# Patient Record
Sex: Female | Born: 1990 | Race: Black or African American | Hispanic: No | Marital: Single | Smoking: Former smoker
Health system: Southern US, Community
[De-identification: ages and names within clinical notes are randomized; demographics above are authoritative.]

## PROBLEM LIST (undated history)

## (undated) DIAGNOSIS — D6800 Von Willebrand disease, unspecified: Secondary | ICD-10-CM

## (undated) HISTORY — PX: TONSILLECTOMY: SUR1361

---
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
## 2011-03-07 ENCOUNTER — Emergency Department: Payer: Self-pay | Admitting: *Deleted

## 2011-09-16 ENCOUNTER — Emergency Department: Payer: Self-pay | Admitting: Emergency Medicine

## 2011-10-10 ENCOUNTER — Emergency Department: Payer: Self-pay | Admitting: Emergency Medicine

## 2012-06-11 ENCOUNTER — Encounter (HOSPITAL_COMMUNITY): Payer: Self-pay | Admitting: Emergency Medicine

## 2012-06-11 ENCOUNTER — Emergency Department (HOSPITAL_COMMUNITY)
Admission: EM | Admit: 2012-06-11 | Discharge: 2012-06-12 | Disposition: A | Discharge status: Home / Self Care | Payer: Medicaid Other | Attending: Emergency Medicine | Admitting: Emergency Medicine

## 2012-06-11 DIAGNOSIS — O219 Vomiting of pregnancy, unspecified: Secondary | ICD-10-CM | POA: Insufficient documentation

## 2012-06-11 HISTORY — DX: Von Willebrand's disease: D68.0

## 2012-06-11 HISTORY — DX: Von Willebrand disease, unspecified: D68.00

## 2012-06-11 LAB — POCT PREGNANCY, URINE: Preg Test, Ur: POSITIVE — AB

## 2012-06-11 LAB — POCT I-STAT, CHEM 8
Calcium, Ion: 1.25 mmol/L — ABNORMAL HIGH (ref 1.12–1.23)
Chloride: 105 mEq/L (ref 96–112)
Creatinine, Ser: 0.7 mg/dL (ref 0.50–1.10)

## 2012-06-11 MED ORDER — ONDANSETRON 4 MG PO TBDP
8.0000 mg | ORAL_TABLET | Freq: Once | ORAL | Status: AC
  Administered 2012-06-12: 8 mg via ORAL
  Filled 2012-06-11: qty 2

## 2012-06-11 NOTE — ED Provider Notes (Signed)
History     CSN: [id number]  Arrival date & time 06/11/12  1929   First MD Initiated Contact with Patient 06/11/12 2348      Chief Complaint  Patient presents with  . Emesis During Pregnancy    (Consider location/radiation/quality/duration/timing/severity/associated sxs/prior treatment) HPI Patient complains of vomiting apparently 7 times since just today. [place] of mild nausea present She is presently [redacted] weeks pregnant. She denies other complaint. Denies abdominal pain denies lightheadedness with standing no headache no fever no other complaint. No treatment prior to coming here. Nothing makes symptoms better or worse Past Medical History  Diagnosis Date  . Von Willebrand disease     History reviewed. No pertinent past surgical history.  No family history on file.  History  Substance Use Topics  . Smoking status: Never Smoker   . Smokeless tobacco: Not on file  . Alcohol Use: No    OB History   Grav Para Term Preterm Abortions TAB SAB Ect Mult Living   1               Review of Systems  Constitutional: Negative.   HENT: Negative.   Respiratory: Negative.   Cardiovascular: Negative.   Gastrointestinal: Positive for nausea and vomiting.  Genitourinary:       Pregnant  Musculoskeletal: Negative.   Skin: Negative.   Neurological: Negative.   Psychiatric/Behavioral: Negative.   All other systems reviewed and are negative.    Allergies  Peanuts and Aspirin  Home Medications   Current Outpatient Rx  Name  Route  Sig  Dispense  Refill  . acetaminophen (TYLENOL) 500 MG tablet   Oral   Take 500 mg by mouth every 6 (six) hours as needed for pain.         . Prenatal Vit-Fe Fumarate-FA (PRENATAL MULTIVITAMIN) TABS   Oral   Take 1 tablet by mouth daily at 12 noon.         Marland Kitchen PRESCRIPTION MEDICATION   Oral   Take 1,000 mg by mouth once.           BP 105/62  Pulse 102  Temp(Src) 98.7 F (37.1 C) (Oral)  Resp 16  SpO2 100%  LMP  03/09/2012  Physical Exam  Nursing note and vitals reviewed. Constitutional: She is oriented to person, place, and time. She appears well-developed and well-nourished.  HENT:  Head: Normocephalic and atraumatic.  Eyes: Conjunctivae are normal. Pupils are equal, round, and reactive to light.  Neck: Neck supple. No tracheal deviation present. No thyromegaly present.  Cardiovascular: Normal rate and regular rhythm.   No murmur heard. Pulmonary/Chest: Effort normal and breath sounds normal.  Abdominal: Soft. Bowel sounds are normal. She exhibits no distension. There is no tenderness.  Musculoskeletal: Normal range of motion. She exhibits no edema and no tenderness.  Neurological: She is alert and oriented to person, place, and time. Coordination normal.  Gait normal, not lightheaded on standing  Skin: Skin is warm and dry. No rash noted.  Psychiatric: She has a normal mood and affect.    ED Course  Procedures (including critical care time)  Labs Reviewed  POCT PREGNANCY, URINE - Abnormal; Notable for the following:    Preg Test, Ur POSITIVE (*)    All other components within normal limits  POCT I-STAT, CHEM 8 - Abnormal; Notable for the following:    Calcium, Ion 1.25 (*)    Hemoglobin 11.2 (*)    HCT 33.0 (*)    All other components within normal  limits   No results found. Results for orders placed during the hospital encounter of 06/11/12  POCT PREGNANCY, URINE      Result Value Range   Preg Test, Ur POSITIVE (*) NEGATIVE  POCT I-STAT, CHEM 8      Result Value Range   Sodium 137  135 - 145 mEq/L   Potassium 3.9  3.5 - 5.1 mEq/L   Chloride 105  96 - 112 mEq/L   BUN 8  6 - 23 mg/dL   Creatinine, Ser 1.91  0.50 - 1.10 mg/dL   Glucose, Bld 84  70 - 99 mg/dL   Calcium, Ion 4.78 (*) 1.12 - 1.23 mmol/L   TCO2 25  0 - 100 mmol/L   Hemoglobin 11.2 (*) 12.0 - 15.0 g/dL   HCT 29.5 (*) 62.1 - 30.8 %   No results found.   No diagnosis found.  Patient minister Zofran orally.  1:25 AM patient is asymptomatic able to drink without nausea or vomiting  MDM  No evidence of dehydration Plan prescription Zofran. Patient has appointment for ultrasound tomorrow with her GYN in [facility] emesis gravidarum        Doug Sou, MD 06/12/12 [phone]

## 2012-06-11 NOTE — ED Notes (Addendum)
Pt states she is [redacted] weeks pregnant.  Reports nausea and vomiting since yesterday.  Vomited x 6 today. 1st OB- Gyn appt tomorrow.

## 2012-06-12 MED ORDER — ONDANSETRON HCL 4 MG PO TABS: 8.0000 mg | ORAL_TABLET | Freq: Three times a day (TID) | ORAL | Status: DC | PRN

## 2012-06-12 NOTE — ED Notes (Signed)
Pt tolerating gingerale after PO zofran.

## 2012-06-24 ENCOUNTER — Emergency Department: Payer: Self-pay | Admitting: Emergency Medicine

## 2013-01-24 ENCOUNTER — Emergency Department: Payer: Self-pay | Admitting: Emergency Medicine

## 2013-02-24 ENCOUNTER — Emergency Department: Payer: Self-pay | Admitting: Emergency Medicine

## 2013-05-31 ENCOUNTER — Emergency Department: Payer: Self-pay | Admitting: Internal Medicine

## 2013-08-22 ENCOUNTER — Inpatient Hospital Stay: Payer: Self-pay | Admitting: Internal Medicine

## 2013-08-22 LAB — URINALYSIS, COMPLETE
Bilirubin,UR: NEGATIVE
Blood: NEGATIVE
GLUCOSE, UR: NEGATIVE mg/dL (ref 0–75)
KETONE: NEGATIVE
Nitrite: NEGATIVE
PROTEIN: NEGATIVE
Ph: 5 (ref 4.5–8.0)
Specific Gravity: 1.038 (ref 1.003–1.030)
WBC UR: 41 /HPF (ref 0–5)

## 2013-08-22 LAB — CBC WITH DIFFERENTIAL/PLATELET
BASOS ABS: 0.1 10*3/uL (ref 0.0–0.1)
Basophil %: 0.4 %
Eosinophil #: 0.2 10*3/uL (ref 0.0–0.7)
Eosinophil %: 0.9 %
HCT: 37 % (ref 35.0–47.0)
HGB: 11.8 g/dL — AB (ref 12.0–16.0)
LYMPHS ABS: 2.5 10*3/uL (ref 1.0–3.6)
LYMPHS PCT: 11.7 %
MCH: 26.5 pg (ref 26.0–34.0)
MCHC: 31.8 g/dL — AB (ref 32.0–36.0)
MCV: 83 fL (ref 80–100)
MONO ABS: 1.2 x10 3/mm — AB (ref 0.2–0.9)
MONOS PCT: 5.8 %
NEUTROS PCT: 81.2 %
Neutrophil #: 17.4 10*3/uL — ABNORMAL HIGH (ref 1.4–6.5)
PLATELETS: 329 10*3/uL (ref 150–440)
RBC: 4.44 10*6/uL (ref 3.80–5.20)
RDW: 14.1 % (ref 11.5–14.5)
WBC: 21.4 10*3/uL — ABNORMAL HIGH (ref 3.6–11.0)

## 2013-08-22 LAB — BASIC METABOLIC PANEL
ANION GAP: 8 (ref 7–16)
BUN: 14 mg/dL (ref 7–18)
Calcium, Total: 9.2 mg/dL (ref 8.5–10.1)
Chloride: 111 mmol/L — ABNORMAL HIGH (ref 98–107)
Co2: 19 mmol/L — ABNORMAL LOW (ref 21–32)
Creatinine: 0.87 mg/dL (ref 0.60–1.30)
EGFR (African American): >60
Glucose: 97 mg/dL (ref 65–99)
OSMOLALITY: 276 (ref 275–301)
POTASSIUM: 4.5 mmol/L (ref 3.5–5.1)
Sodium: 138 mmol/L (ref 136–145)

## 2013-08-22 LAB — MONONUCLEOSIS SCREEN: Mono Test: NEGATIVE

## 2013-08-23 LAB — CBC WITH DIFFERENTIAL/PLATELET
BASOS ABS: 0 10*3/uL (ref 0.0–0.1)
Basophil %: 0.2 %
EOS PCT: 2.1 %
Eosinophil #: 0.3 10*3/uL (ref 0.0–0.7)
HCT: 33.5 % — ABNORMAL LOW (ref 35.0–47.0)
HGB: 10.7 g/dL — AB (ref 12.0–16.0)
LYMPHS PCT: 21.4 %
Lymphocyte #: 3.3 10*3/uL (ref 1.0–3.6)
MCH: 26.9 pg (ref 26.0–34.0)
MCHC: 32 g/dL (ref 32.0–36.0)
MCV: 84 fL (ref 80–100)
Monocyte #: 0.7 x10 3/mm (ref 0.2–0.9)
Monocyte %: 4.4 %
NEUTROS PCT: 71.9 %
Neutrophil #: 11.1 10*3/uL — ABNORMAL HIGH (ref 1.4–6.5)
Platelet: 284 10*3/uL (ref 150–440)
RBC: 3.98 10*6/uL (ref 3.80–5.20)
RDW: 14.2 % (ref 11.5–14.5)
WBC: 15.5 10*3/uL — ABNORMAL HIGH (ref 3.6–11.0)

## 2013-08-24 LAB — BETA STREP CULTURE(ARMC)

## 2013-08-27 LAB — CULTURE, BLOOD (SINGLE)

## 2013-09-15 ENCOUNTER — Emergency Department: Payer: Self-pay | Admitting: Internal Medicine

## 2013-09-15 LAB — URINALYSIS, COMPLETE
BILIRUBIN, UR: NEGATIVE
Glucose,UR: NEGATIVE mg/dL (ref 0–75)
Ketone: NEGATIVE
Nitrite: NEGATIVE
PH: 5 (ref 4.5–8.0)
PROTEIN: NEGATIVE
RBC,UR: 257 /HPF (ref 0–5)
Specific Gravity: 1.023 (ref 1.003–1.030)
Squamous Epithelial: 7

## 2013-09-15 LAB — MONONUCLEOSIS SCREEN: MONO TEST: NEGATIVE

## 2013-09-16 LAB — URINE CULTURE

## 2013-09-17 LAB — BETA STREP CULTURE(ARMC)

## 2014-02-04 ENCOUNTER — Encounter (HOSPITAL_COMMUNITY): Payer: Self-pay | Admitting: Emergency Medicine

## 2014-07-27 NOTE — H&P (Signed)
PATIENT NAME:  Abigail Rios, Abigail Rios DATE OF BIRTH:  1990/06/27  DATE OF ADMISSION:  08/22/2013  PRIMARY CARE PHYSICIAN: None.   CHIEF COMPLAINT: Body aches, sore throat, fever and nausea or vomiting.   HISTORY OF PRESENT ILLNESS:  Ms. Abigail Rios is a 24 year old African American female with history of von Willebrand's disease, comes to the Emergency Room after she started having body aches, nausea, vomiting and a fever at home of 103. The patient's temperature here in the Emergency Room was 98.9. She was somewhat tachycardiac, on workup showed a urinary tract infection and elevated white count of 21,000. She was started on IV Zosyn and IV fluids were given. In the Emergency Room. Blood cultures were sent. Urine culture was sent. She is being admitted for further evaluation and management.   PAST MEDICAL HISTORY: 1.  Von Willebrand's disease.  2.  Tonsillectomy and adenoidectomy.   ALLERGIES: ASPIRIN AND PEANUTS.   SOCIAL HISTORY: Lives at home, smokes about 2 to 3 cigarettes every day, nonalcoholic. Denies any other drug use.   FAMILY HISTORY: Positive for hypertension.   REVIEW OF SYSTEMS:   CONSTITUTIONAL: Positive for fever, fatigue, weakness.  EYES: No blurred or double vision.  ENT: No tinnitus, ear pain, hearing loss. Positive for sore throat.  CARDIOVASCULAR: No chest pain, orthopnea, edema or hypertension.  GASTROINTESTINAL: Positive for nausea and vomiting. No diarrhea or abdominal pain. No GERD.  GENITOURINARY: No dysuria, hematuria or frequency. Positive for change in color of urine.  ENDOCRINE: No polyuria, nocturia or thyroid problems.  HEMATOLOGY: No anemia or easy bruising. Positive for bleeding disorder which is von Willebrand's disorder.  MUSCULOSKELETAL: Positive for back pain.  NEUROLOGICAL:  No CVA, transient ischemic attack or dysarthria, vertigo.  PSYCHIATRIC: No anxiety or depression.  All other systems reviewed and negative.   PHYSICAL  EXAMINATION: GENERAL: The patient is awake, alert and oriented x 3. Mild to moderate distress due to due to increased fatigability.  VITAL SIGNS: Temperature is 99.8, pulse is 80, blood pressure is 103/67, sats are 99% on room air.  HEENT: Atraumatic, normocephalic. Pupils are equal, round and reactive to light and accommodation. EOM intact. Oral mucosa is moist.  NECK: Supple. No JVD. No carotid bruit.  LUNGS: Clear to auscultation bilaterally. No rales, rhonchi, respiratory distress or labored breathing.  HEART: Both the heart sounds are normal. Mild tachycardia, no murmur heard. PMI not lateralized. Chest nontender.  EXTREMITIES: Good pedal pulses, good femoral pulses. No lower extremity edema.  ABDOMEN: Soft, benign, nontender. No organomegaly. Positive bowel sounds.  NEUROLOGIC: Grossly intact cranial nerves II through XII. No motor or sensory deficit.  SKIN: Warm and dry. No rash.  PSYCHIATRIC: The patient is awake, alert, oriented x 3.   LABORATORY, DIAGNOSTIC AND RADIOLOGICAL DATA:  Blood culture negative in 8 to 12 hours. Urinalysis positive for urinary tract infection. Mono test is negative. White count is 21.0, hemoglobin and hematocrit is 11.8 and 37.0, platelet count is 329. Basic metabolic panel within normal limits except bicarbonate of 19. Urine pregnancy test is negative.   ASSESSMENT AND PLAN: A 24 year old Abigail Rios with history of von Willebrand's disease, comes in with:  1.  Suspected acute pyelonephritis. The patient came in with fever, body aches, back pain, elevated white count and abnormal urine. She has been started on IV fluids. Continue Zosyn, will continue Rocephin for now. Follow blood cultures which have been negative so far. Follow up urine cultures. We will order CBC for tomorrow.  2.  Nausea and vomiting with poor p.o. intake, suspected due to #1.  Continue p.r.n. Zofran and continue IV fluids for hydration.  3.  Elevated white count, suspected due to urinary  tract infection.  4.  Deep vein thrombosis prophylaxis. Subcutaneous heparin t.i.d.  5.  Further workup according to the patient's clinical course.   Hospital admission plan was discussed with the patient and the patient's family members were present in the room.   TIME SPENT: 50 minutes.   ____________________________ Wylie Hail Allena Katz, MD sap:cs D: 08/22/2013 14:38:18 ET T: 08/22/2013 15:02:40 ET JOB#: [id number]  cc: Tajay Muzzy A. Allena Katz, MD, <Dictator> Willow Ora MD ELECTRONICALLY SIGNED 08/26/2013 16:24

## 2014-07-27 NOTE — Discharge Summary (Signed)
PATIENT NAME:  Abigail Rios, Jaleya M MR#:  [id number] DATE OF BIRTH:  Apr 12, 1990  DATE OF ADMISSION:  08/22/2013 DATE OF DISCHARGE:  08/23/2013  DISCHARGE DIAGNOSES: 1. Acute viral syndrome. 2. AcutePyelonephritis.  DISCHARGE MEDICATIONS: Keflex 500 mg p.o. 4 times daily.  DIET: Regular diet.   CONSULTATIONS: None.   HOSPITAL COURSE: The patient is a 24 year old female patient admitted  on 20th because of generalized body aches, sore throat, fever, and nausea and fever. Look at the history and physical for full details. The patient had a fever of 103 at home and initial white count in the Emergency Room was 21,000 and initial temperature in the Emergency Room 98.9. The patient was seen in the Emergency Room and patient admitted to hospitalist service because of suspected pyelonephritis. The patient's urea was positive for infection and her mono test was negative so she was started on IV Rocephin and we started on lots of fluids for her. Nausea improved with IV Zofran.   Today I saw the patient. She is much better. No more nausea, tolerated the diet, and did not have any body aches or fever and the patient's white count on admission was 21.4 and then yesterday it was 15.5. The patient's urine cultures report are pending and beta strep cultures are negative. The patient's kidney function is stable and I discharged her with Keflex to take and the patient's condition is stable.   VITALS SIGNS: On discharge: Temperature 98.7, heart rate 75, blood pressure 99/63, sats 99% on room air. The patient was discharged home in stable condition.   PHYSICAL EXAMINATION:  LUNGS: Clear.  GASTROINTESTINAL: Nontender, , nondistended. No CVA tenderness.  CARDIOVASCULAR: S1, S2 regular. No murmurs.   TIME SPENT: More than 30 minutes.    ____________________________ Katha HammingSnehalatha Remi Lopata, MD sk:lt D: 08/24/2013 14:27:46 ET T: 08/24/2013 21:44:54 ET JOB#: [id number]  cc: Katha HammingSnehalatha Mckinzi Eriksen, MD, <Dictator> Katha HammingSNEHALATHA  Logyn Kendrick MD ELECTRONICALLY SIGNED 08/30/2013 12:06

## 2014-12-06 ENCOUNTER — Encounter: Payer: Self-pay | Admitting: Emergency Medicine

## 2014-12-06 ENCOUNTER — Emergency Department
Admission: EM | Admit: 2014-12-06 | Discharge: 2014-12-06 | Disposition: A | Discharge status: Home / Self Care | Payer: Medicaid Other | Attending: Emergency Medicine | Admitting: Emergency Medicine

## 2014-12-06 DIAGNOSIS — Y999 Unspecified external cause status: Secondary | ICD-10-CM | POA: Insufficient documentation

## 2014-12-06 DIAGNOSIS — Z720 Tobacco use: Secondary | ICD-10-CM | POA: Insufficient documentation

## 2014-12-06 DIAGNOSIS — X58XXXA Exposure to other specified factors, initial encounter: Secondary | ICD-10-CM | POA: Insufficient documentation

## 2014-12-06 DIAGNOSIS — S46811A Strain of other muscles, fascia and tendons at shoulder and upper arm level, right arm, initial encounter: Secondary | ICD-10-CM

## 2014-12-06 DIAGNOSIS — Y929 Unspecified place or not applicable: Secondary | ICD-10-CM | POA: Insufficient documentation

## 2014-12-06 DIAGNOSIS — S46911A Strain of unspecified muscle, fascia and tendon at shoulder and upper arm level, right arm, initial encounter: Secondary | ICD-10-CM | POA: Insufficient documentation

## 2014-12-06 DIAGNOSIS — Y939 Activity, unspecified: Secondary | ICD-10-CM | POA: Insufficient documentation

## 2014-12-06 DIAGNOSIS — Z79899 Other long term (current) drug therapy: Secondary | ICD-10-CM | POA: Insufficient documentation

## 2014-12-06 MED ORDER — KETOROLAC TROMETHAMINE 60 MG/2ML IM SOLN
INTRAMUSCULAR | Status: AC
  Filled 2014-12-06: qty 2

## 2014-12-06 MED ORDER — TIZANIDINE HCL 4 MG PO TABS: 4.0000 mg | ORAL_TABLET | Freq: Three times a day (TID) | ORAL | Status: AC

## 2014-12-06 MED ORDER — KETOROLAC TROMETHAMINE 60 MG/2ML IM SOLN
60.0000 mg | Freq: Once | INTRAMUSCULAR | Status: AC
  Administered 2014-12-06: 60 mg via INTRAMUSCULAR

## 2014-12-06 NOTE — ED Provider Notes (Signed)
[facility] Emergency Department Provider Note ____________________________________________  Time seen: Approximately 11:08 AM  I have reviewed the triage vital signs and the nursing notes.   HISTORY  Chief Complaint Shoulder Pain   HPI Abigail Rios is a 24 y.o. female who presents to the emergency department for evaluation of shoulder pain that started last night and worsened through the night. She denies injury. She has not taken anything for pain. Pain increases with any movement.   Past Medical History  Diagnosis Date  . Von Willebrand disease     There are no active problems to display for this patient.   Past Surgical History  Procedure Laterality Date  . Tonsillectomy      Current Outpatient Rx  Name  Route  Sig  Dispense  Refill  . acetaminophen (TYLENOL) 500 MG tablet   Oral   Take 500 mg by mouth every 6 (six) hours as needed for pain.         Marland Kitchen ondansetron (ZOFRAN) 4 MG tablet   Oral   Take 2 tablets (8 mg total) by mouth every 8 (eight) hours as needed for nausea.   20 tablet   0   . Prenatal Vit-Fe Fumarate-FA (PRENATAL MULTIVITAMIN) TABS   Oral   Take 1 tablet by mouth daily at 12 noon.         Marland Kitchen PRESCRIPTION MEDICATION   Oral   Take 1,000 mg by mouth once.         Marland Kitchen tiZANidine (ZANAFLEX) 4 MG tablet   Oral   Take 1 tablet (4 mg total) by mouth 3 (three) times daily.   30 tablet   0     Allergies Peanuts and Aspirin  No family history on file.  Social History Social History  Substance Use Topics  . Smoking status: Current Every Day Smoker  . Smokeless tobacco: None  . Alcohol Use: No    Review of Systems Constitutional: No recent illness. Eyes: No visual changes. ENT: No sore throat. Cardiovascular: Denies chest pain or palpitations. Respiratory: Denies shortness of breath. Gastrointestinal: No abdominal pain.  Genitourinary: Negative for dysuria. Musculoskeletal: Pain in right shoulder. Skin:  Negative for rash. Neurological: Negative for headaches, focal weakness or numbness. 10-point ROS otherwise negative.  ____________________________________________   PHYSICAL EXAM:  VITAL SIGNS: ED Triage Vitals  Enc Vitals Group     BP 12/06/14 0744 106/61 mmHg     Pulse Rate 12/06/14 0744 68     Resp 12/06/14 0744 18     Temp 12/06/14 0744 98.1 F (36.7 C)     Temp Source 12/06/14 0744 Oral     SpO2 12/06/14 0744 96 %     Weight 12/06/14 0745 209 lb (94.802 kg)     Height 12/06/14 0745  (1.702 m)     Head Cir --      Peak Flow --      Pain Score 12/06/14 0746 8     Pain Loc --      Pain Edu? --      Excl. in GC? --     Constitutional: Alert and oriented. Well appearing and in no acute distress. Eyes: Conjunctivae are normal. EOMI. Head: Atraumatic. Nose: No congestion/rhinnorhea. Neck: No stridor.  Respiratory: Normal respiratory effort.   Musculoskeletal: Tenderness with light palpation over trapezius area. Neurologic:  Normal speech and language. No gross focal neurologic deficits are appreciated. Speech is normal. No gait instability. Skin:  Skin is warm, dry and  intact. Atraumatic. Psychiatric: Mood and affect are normal. Speech and behavior are normal.  ____________________________________________   LABS (all labs ordered are listed, but only abnormal results are displayed)  Labs Reviewed - No data to display ____________________________________________  RADIOLOGY  Not indicated ____________________________________________   PROCEDURES  Procedure(s) performed: None   ____________________________________________   INITIAL IMPRESSION / ASSESSMENT AND PLAN / ED COURSE  Pertinent labs & imaging results that were available during my care of the patient were reviewed by me and considered in my medical decision making (see chart for details).  IM Toradol given in the ER. She is to follow up with the orthopedist or primary care for symptoms that  are not improving over the next few days.She was advised to return to the ER for symptoms that change or worsen if unable to schedule an appointment.  ____________________________________________   FINAL CLINICAL IMPRESSION(S) / ED DIAGNOSES  Final diagnoses:  Trapezius muscle strain, right, initial encounter       Chinita Pester, FNP 12/06/14 1119  Sharyn Creamer, MD 12/06/14 1527

## 2014-12-06 NOTE — ED Notes (Signed)
Pt c/o right shoulder pain that started yesterday, denies injury.Abigail Rios

## 2015-07-14 ENCOUNTER — Emergency Department
Admission: EM | Admit: 2015-07-14 | Discharge: 2015-07-14 | Disposition: A | Discharge status: Home / Self Care | Payer: Medicaid Other | Attending: Emergency Medicine | Admitting: Emergency Medicine

## 2015-07-14 ENCOUNTER — Encounter: Payer: Self-pay | Admitting: Emergency Medicine

## 2015-07-14 DIAGNOSIS — O9989 Other specified diseases and conditions complicating pregnancy, childbirth and the puerperium: Secondary | ICD-10-CM | POA: Diagnosis present

## 2015-07-14 DIAGNOSIS — Z87891 Personal history of nicotine dependence: Secondary | ICD-10-CM | POA: Diagnosis not present

## 2015-07-14 DIAGNOSIS — M5441 Lumbago with sciatica, right side: Secondary | ICD-10-CM | POA: Insufficient documentation

## 2015-07-14 DIAGNOSIS — Z3A16 16 weeks gestation of pregnancy: Secondary | ICD-10-CM | POA: Insufficient documentation

## 2015-07-14 LAB — URINALYSIS COMPLETE WITH MICROSCOPIC (ARMC ONLY)
BILIRUBIN URINE: NEGATIVE
GLUCOSE, UA: NEGATIVE mg/dL
HGB URINE DIPSTICK: NEGATIVE
NITRITE: NEGATIVE
PH: 6 (ref 5.0–8.0)
Protein, ur: NEGATIVE mg/dL
SPECIFIC GRAVITY, URINE: 1.021 (ref 1.005–1.030)

## 2015-07-14 NOTE — ED Provider Notes (Signed)
[facility] Emergency Department Provider Note  ____________________________________________  Time seen: Approximately 1:02 PM  I have reviewed the triage vital signs and the nursing notes.   HISTORY  Chief Complaint Fall    HPI Abigail Rios is a 25 y.o. female , NAD, presents to the emergency department via EMS after a fall at home. Her mother is at the bedside. States that she is [redacted] weeks pregnant has been having lower back pain over the last 3 days. Noted some weakness in the right lower leg and as needed assistance from her significant other with ambulation at times. States she was on her own today, stood to go the restroom and fell onto her right buttock. She denies any trauma to the abdomen. Is not having any abdominal pain, vaginal bleeding, vaginal discharge. Did take some Tylenol last night with mild alleviation of pain but no resolution of symptoms. States she is up-to-date on her obstetric follow-ups and has had no complications to her current pregnancy. Denies dysuria, hematuria, urinary frequency, urgency, hesitancy. Has had no fevers, chills, body aches. Denies any chest pain or shortness of breath. Did not incur a head injury during her fall.   Past Medical History  Diagnosis Date  . Von Willebrand disease (HCC)     There are no active problems to display for this patient.   Past Surgical History  Procedure Laterality Date  . Tonsillectomy      Current Outpatient Rx  Name  Route  Sig  Dispense  Refill  . acetaminophen (TYLENOL) 500 MG tablet   Oral   Take 500 mg by mouth every 6 (six) hours as needed for pain.         Marland Kitchen ondansetron (ZOFRAN) 4 MG tablet   Oral   Take 2 tablets (8 mg total) by mouth every 8 (eight) hours as needed for nausea.   20 tablet   0   . Prenatal Vit-Fe Fumarate-FA (PRENATAL MULTIVITAMIN) TABS   Oral   Take 1 tablet by mouth daily at 12 noon.         Marland Kitchen PRESCRIPTION MEDICATION   Oral   Take 1,000 mg by  mouth once.         Marland Kitchen tiZANidine (ZANAFLEX) 4 MG tablet   Oral   Take 1 tablet (4 mg total) by mouth 3 (three) times daily.   30 tablet   0     Allergies Peanuts and Aspirin  No family history on file.  Social History Social History  Substance Use Topics  . Smoking status: Former Games developer  . Smokeless tobacco: None  . Alcohol Use: No     Review of Systems  Constitutional: No fever/chills, fatigue Eyes: No visual changes. Cardiovascular: No chest pain. Respiratory:  No shortness of breath. No wheezing.  Gastrointestinal: No abdominal pain.  No nausea, vomiting.  No diarrhea, constipation. Genitourinary: Negative for dysuria, hematuria. No urinary hesitancy, urgency or increased frequency. No vaginal discharge or bleeding. Musculoskeletal: Positive for back pain radiating into buttock.  Skin: Negative for rash or bruising, skin sores, open wounds. Neurological: Positive weakness left lower leg. Negative for headaches, focal weakness or numbness. No saddle paresthesias nor loss of bowel or bladder control. 10-point ROS otherwise negative.  ____________________________________________   PHYSICAL EXAM:  VITAL SIGNS: ED Triage Vitals  Enc Vitals Group     BP 07/14/15 1218 114/63 mmHg     Pulse Rate 07/14/15 1218 87     Resp 07/14/15 1218 20  Temp 07/14/15 1218 98.4 F (36.9 C)     Temp Source 07/14/15 1218 Oral     SpO2 07/14/15 1218 98 %     Weight 07/14/15 1218 205 lb (92.987 kg)     Height 07/14/15 1218  (1.626 m)     Head Cir --      Peak Flow --      Pain Score 07/14/15 1218 7     Pain Loc --      Pain Edu? --      Excl. in GC? --      Constitutional: Alert and oriented. Well appearing and in no acute distress. Eyes: Conjunctivae are normal. PERRL. EOMI without pain.  Head: Atraumatic. Neck: No cervical spine tenderness to palpation. Supple with full range of motion. Hematological/Lymphatic/Immunilogical: No cervical  lymphadenopathy. Cardiovascular: Normal rate, regular rhythm. Normal S1 and S2.  Good peripheral circulation with 2+ pulses noted in bilateral upper and lower extremities. Respiratory: Normal respiratory effort without tachypnea or retractions. Lungs CTAB with breath sounds noted throughout all lung fields. Gastrointestinal: Soft and nontender in all quadrants. No distention, guarding. No CVA tenderness. Musculoskeletal: Positive right lower back tenderness to palpation in the paraspinal region. Mild muscle spasm appreciated in the right lower back. Right SI joint tenderness with deep palpation with reproduction of patient's pain. Negative bilateral straight leg raise. No lower extremity tenderness nor edema.  No joint effusions. Neurologic:  Normal speech and language. No gross focal neurologic deficits are appreciated. CN III-XII grossly in tact Skin:  Skin is warm, dry and intact. No rash noted. Psychiatric: Mood and affect are normal. Speech and behavior are normal. Patient exhibits appropriate insight and judgement.   ____________________________________________   LABS (all labs ordered are listed, but only abnormal results are displayed)  Labs Reviewed  URINALYSIS COMPLETEWITH MICROSCOPIC ([facility] ONLY) - Abnormal; Notable for the following:    Color, Urine YELLOW (*)    APPearance HAZY (*)    Ketones, ur 1+ (*)    Leukocytes, UA 3+ (*)    Bacteria, UA RARE (*)    Squamous Epithelial / LPF 6-30 (*)    All other components within normal limits  URINE CULTURE   ____________________________________________  EKG  None ____________________________________________  RADIOLOGY  None ____________________________________________    PROCEDURES  Procedure(s) performed: None    Medications - No data to display   ____________________________________________   INITIAL IMPRESSION / ASSESSMENT AND PLAN / ED COURSE  Pertinent lab results that were available during my care of  the patient were reviewed by me and considered in my medical decision making (see chart for details). Urine culture results were not available at the time of discharge. Patient will be called with those results when available.  Patient's assessment and plan was discussed with Dr. Jene Every who agrees with ED course and instructions below.  Patient's diagnosis is consistent with acute right-sided lower back pain with sciatica. Long discussion had with the patient and her mother who is at the bedside in regards to the patient's symptoms. Physical exam and history is consistent with lower back pain and sciatica. This can be common in pregnancy. Considering the patient's exam is reassuring and she continues to deny any abdominal pain, vaginal bleeding or other vaginal discharge she will be discharged with instructions to take Tylenol as needed. Apply warm heat to the lower back 20 minutes 3-4 times daily as needed. Also light stretching and range of motion exercises were discussed and modeled. It was also discussed  with the patient and her mother that it is possible for miscarriage after falls in early pregnancy. Patient is to monitor for any changing symptoms.   Patient is currently asymptomatic for urinary tract infection at this time so we will with hold starting antibiotics until culture results are available. Patient is to follow up with her primary care provider or her OB/GYN if symptoms persist past this treatment course. Patient is given ED precautions to return to the ED for any worsening or new symptoms.    ____________________________________________  FINAL CLINICAL IMPRESSION(S) / ED DIAGNOSES  Final diagnoses:  Acute right-sided low back pain with right-sided sciatica      NEW MEDICATIONS STARTED DURING THIS VISIT:  Discharge Medication List as of 07/14/2015  1:05 PM           Hope PigeonJami L Jaymar Loeber, PA-C 07/14/15 1410  Jene Everyobert Kinner, MD 07/14/15 1441

## 2015-07-14 NOTE — Discharge Instructions (Signed)
May take Tylenol as needed for pain. Apply warm heat to the lower back x 20 minutes 3-4 times daily.   Sciatica With Rehab The sciatic nerve runs from the back down the leg and is responsible for sensation and control of the muscles in the back (posterior) side of the thigh, lower leg, and foot. Sciatica is a condition that is characterized by inflammation of this nerve.  SYMPTOMS   Signs of nerve damage, including numbness and/or weakness along the posterior side of the lower extremity.  Pain in the back of the thigh that may also travel down the leg.  Pain that worsens when sitting for long periods of time.  Occasionally, pain in the back or buttock. CAUSES  Inflammation of the sciatic nerve is the cause of sciatica. The inflammation is due to something irritating the nerve. Common sources of irritation include:  Sitting for long periods of time.  Direct trauma to the nerve.  Arthritis of the spine.  Herniated or ruptured disk.  Slipping of the vertebrae (spondylolisthesis).  Pressure from soft tissues, such as muscles or ligament-like tissue (fascia). RISK INCREASES WITH:  Sports that place pressure or stress on the spine (football or weightlifting).  Poor strength and flexibility.  Failure to warm up properly before activity.  Family history of low back pain or disk disorders.  Previous back injury or surgery.  Poor body mechanics, especially when lifting, or poor posture. PREVENTION   Warm up and stretch properly before activity.  Maintain physical fitness:  Strength, flexibility, and endurance.  Cardiovascular fitness.  Learn and use proper technique, especially with posture and lifting. When possible, have coach correct improper technique.  Avoid activities that place stress on the spine. PROGNOSIS If treated properly, then sciatica usually resolves within 6 weeks. However, occasionally surgery is necessary.  RELATED COMPLICATIONS   Permanent nerve  damage, including pain, numbness, tingle, or weakness.  Chronic back pain.  Risks of surgery: infection, bleeding, nerve damage, or damage to surrounding tissues. TREATMENT Treatment initially involves resting from any activities that aggravate your symptoms. The use of ice and medication may help reduce pain and inflammation. The use of strengthening and stretching exercises may help reduce pain with activity. These exercises may be performed at home or with referral to a therapist. A therapist may recommend further treatments, such as transcutaneous electronic nerve stimulation (TENS) or ultrasound. Your caregiver may recommend corticosteroid injections to help reduce inflammation of the sciatic nerve. If symptoms persist despite non-surgical (conservative) treatment, then surgery may be recommended. MEDICATION  If pain medication is necessary, then nonsteroidal anti-inflammatory medications, such as aspirin and ibuprofen, or other minor pain relievers, such as acetaminophen, are often recommended.  Do not take pain medication for 7 days before surgery.  Prescription pain relievers may be given if deemed necessary by your caregiver. Use only as directed and only as much as you need.  Ointments applied to the skin may be helpful.  Corticosteroid injections may be given by your caregiver. These injections should be reserved for the most serious cases, because they may only be given a certain number of times. HEAT AND COLD  Cold treatment (icing) relieves pain and reduces inflammation. Cold treatment should be applied for 10 to 15 minutes every 2 to 3 hours for inflammation and pain and immediately after any activity that aggravates your symptoms. Use ice packs or massage the area with a piece of ice (ice massage).  Heat treatment may be used prior to performing the stretching and  strengthening activities prescribed by your caregiver, physical therapist, or athletic trainer. Use a heat pack or  soak the injury in warm water. SEEK MEDICAL CARE IF:  Treatment seems to offer no benefit, or the condition worsens.  Any medications produce adverse side effects. EXERCISES  RANGE OF MOTION (ROM) AND STRETCHING EXERCISES - Sciatica Most people with sciatic will find that their symptoms worsen with either excessive bending forward (flexion) or arching at the low back (extension). The exercises which will help resolve your symptoms will focus on the opposite motion. Your physician, physical therapist or athletic trainer will help you determine which exercises will be most helpful to resolve your low back pain. Do not complete any exercises without first consulting with your clinician. Discontinue any exercises which worsen your symptoms until you speak to your clinician. If you have pain, numbness or tingling which travels down into your buttocks, leg or foot, the goal of the therapy is for these symptoms to move closer to your back and eventually resolve. Occasionally, these leg symptoms will get better, but your low back pain may worsen; this is typically an indication of progress in your rehabilitation. Be certain to be very alert to any changes in your symptoms and the activities in which you participated in the 24 hours prior to the change. Sharing this information with your clinician will allow him/her to most efficiently treat your condition. These exercises may help you when beginning to rehabilitate your injury. Your symptoms may resolve with or without further involvement from your physician, physical therapist or athletic trainer. While completing these exercises, remember:   Restoring tissue flexibility helps normal motion to return to the joints. This allows healthier, less painful movement and activity.  An effective stretch should be held for at least 30 seconds.  A stretch should never be painful. You should only feel a gentle lengthening or release in the stretched tissue. FLEXION  RANGE OF MOTION AND STRETCHING EXERCISES: STRETCH - Flexion, Single Knee to Chest   Lie on a firm bed or floor with both legs extended in front of you.  Keeping one leg in contact with the floor, bring your opposite knee to your chest. Hold your leg in place by either grabbing behind your thigh or at your knee.  Pull until you feel a gentle stretch in your low back. Hold __________ seconds.  Slowly release your grasp and repeat the exercise with the opposite side. Repeat __________ times. Complete this exercise __________ times per day.  STRETCH - Flexion, Double Knee to Chest  Lie on a firm bed or floor with both legs extended in front of you.  Keeping one leg in contact with the floor, bring your opposite knee to your chest.  Tense your stomach muscles to support your back and then lift your other knee to your chest. Hold your legs in place by either grabbing behind your thighs or at your knees.  Pull both knees toward your chest until you feel a gentle stretch in your low back. Hold __________ seconds.  Tense your stomach muscles and slowly return one leg at a time to the floor. Repeat __________ times. Complete this exercise __________ times per day.  STRETCH - Low Trunk Rotation   Lie on a firm bed or floor. Keeping your legs in front of you, bend your knees so they are both pointed toward the ceiling and your feet are flat on the floor.  Extend your arms out to the side. This will stabilize your upper  body by keeping your shoulders in contact with the floor.  Gently and slowly drop both knees together to one side until you feel a gentle stretch in your low back. Hold for __________ seconds.  Tense your stomach muscles to support your low back as you bring your knees back to the starting position. Repeat the exercise to the other side. Repeat __________ times. Complete this exercise __________ times per day   STRENGTHENING EXERCISES - Sciatica  These exercises may help you  when beginning to rehabilitate your injury. These exercises should be done near your "sweet spot." This is the neutral, low-back arch, somewhere between fully rounded and fully arched, that is your least painful position. When performed in this safe range of motion, these exercises can be used for people who have either a flexion or extension based injury. These exercises may resolve your symptoms with or without further involvement from your physician, physical therapist or athletic trainer. While completing these exercises, remember:   Muscles can gain both the endurance and the strength needed for everyday activities through controlled exercises.  Complete these exercises as instructed by your physician, physical therapist or athletic trainer. Progress with the resistance and repetition exercises only as your caregiver advises.  You may experience muscle soreness or fatigue, but the pain or discomfort you are trying to eliminate should never worsen during these exercises. If this pain does worsen, stop and make certain you are following the directions exactly. If the pain is still present after adjustments, discontinue the exercise until you can discuss the trouble with your clinician. STRENGTHENING - Deep Abdominals, Pelvic Tilt   Lie on a firm bed or floor. Keeping your legs in front of you, bend your knees so they are both pointed toward the ceiling and your feet are flat on the floor.  Tense your lower abdominal muscles to press your low back into the floor. This motion will rotate your pelvis so that your tail bone is scooping upwards rather than pointing at your feet or into the floor.  With a gentle tension and even breathing, hold this position for __________ seconds. Repeat __________ times. Complete this exercise __________ times per day.  STRENGTHENING - Abdominals, Crunches   Lie on a firm bed or floor. Keeping your legs in front of you, bend your knees so they are both pointed toward  the ceiling and your feet are flat on the floor. Cross your arms over your chest.  Slightly tip your chin down without bending your neck.  Tense your abdominals and slowly lift your trunk high enough to just clear your shoulder blades. Lifting higher can put excessive stress on the low back and does not further strengthen your abdominal muscles.  Control your return to the starting position. Repeat __________ times. Complete this exercise __________ times per day.  STRENGTHENING - Quadruped, Opposite UE/LE Lift  Assume a hands and knees position on a firm surface. Keep your hands under your shoulders and your knees under your hips. You may place padding under your knees for comfort.  Find your neutral spine and gently tense your abdominal muscles so that you can maintain this position. Your shoulders and hips should form a rectangle that is parallel with the floor and is not twisted.  Keeping your trunk steady, lift your right hand no higher than your shoulder and then your left leg no higher than your hip. Make sure you are not holding your breath. Hold this position __________ seconds.  Continuing to keep your  abdominal muscles tense and your back steady, slowly return to your starting position. Repeat with the opposite arm and leg. Repeat __________ times. Complete this exercise __________ times per day.  STRENGTHENING - Abdominals and Quadriceps, Straight Leg Raise   Lie on a firm bed or floor with both legs extended in front of you.  Keeping one leg in contact with the floor, bend the other knee so that your foot can rest flat on the floor.  Find your neutral spine, and tense your abdominal muscles to maintain your spinal position throughout the exercise.  Slowly lift your straight leg off the floor about 6 inches for a count of 15, making sure to not hold your breath.  Still keeping your neutral spine, slowly lower your leg all the way to the floor. Repeat this exercise with each  leg __________ times. Complete this exercise __________ times per day. POSTURE AND BODY MECHANICS CONSIDERATIONS - Sciatica Keeping correct posture when sitting, standing or completing your activities will reduce the stress put on different body tissues, allowing injured tissues a chance to heal and limiting painful experiences. The following are general guidelines for improved posture. Your physician or physical therapist will provide you with any instructions specific to your needs. While reading these guidelines, remember:  The exercises prescribed by your provider will help you have the flexibility and strength to maintain correct postures.  The correct posture provides the optimal environment for your joints to work. All of your joints have less wear and tear when properly supported by a spine with good posture. This means you will experience a healthier, less painful body.  Correct posture must be practiced with all of your activities, especially prolonged sitting and standing. Correct posture is as important when doing repetitive low-stress activities (typing) as it is when doing a single heavy-load activity (lifting). RESTING POSITIONS Consider which positions are most painful for you when choosing a resting position. If you have pain with flexion-based activities (sitting, bending, stooping, squatting), choose a position that allows you to rest in a less flexed posture. You would want to avoid curling into a fetal position on your side. If your pain worsens with extension-based activities (prolonged standing, working overhead), avoid resting in an extended position such as sleeping on your stomach. Most people will find more comfort when they rest with their spine in a more neutral position, neither too rounded nor too arched. Lying on a non-sagging bed on your side with a pillow between your knees, or on your back with a pillow under your knees will often provide some relief. Keep in mind, being  in any one position for a prolonged period of time, no matter how correct your posture, can still lead to stiffness. PROPER SITTING POSTURE In order to minimize stress and discomfort on your spine, you must sit with correct posture Sitting with good posture should be effortless for a healthy body. Returning to good posture is a gradual process. Many people can work toward this most comfortably by using various supports until they have the flexibility and strength to maintain this posture on their own. When sitting with proper posture, your ears will fall over your shoulders and your shoulders will fall over your hips. You should use the back of the chair to support your upper back. Your low back will be in a neutral position, just slightly arched. You may place a small pillow or folded towel at the base of your low back for support.  When working at a  desk, create an environment that supports good, upright posture. Without extra support, muscles fatigue and lead to excessive strain on joints and other tissues. Keep these recommendations in mind: CHAIR:   A chair should be able to slide under your desk when your back makes contact with the back of the chair. This allows you to work closely.  The chair's height should allow your eyes to be level with the upper part of your monitor and your hands to be slightly lower than your elbows. BODY POSITION  Your feet should make contact with the floor. If this is not possible, use a foot rest.  Keep your ears over your shoulders. This will reduce stress on your neck and low back. INCORRECT SITTING POSTURES   If you are feeling tired and unable to assume a healthy sitting posture, do not slouch or slump. This puts excessive strain on your back tissues, causing more damage and pain. Healthier options include:  Using more support, like a lumbar pillow.  Switching tasks to something that requires you to be upright or walking.  Talking a brief walk.  Lying  down to rest in a neutral-spine position. PROLONGED STANDING WHILE SLIGHTLY LEANING FORWARD  When completing a task that requires you to lean forward while standing in one place for a long time, place either foot up on a stationary 2-4 inch high object to help maintain the best posture. When both feet are on the ground, the low back tends to lose its slight inward curve. If this curve flattens (or becomes too large), then the back and your other joints will experience too much stress, fatigue more quickly and can cause pain.  CORRECT STANDING POSTURES Proper standing posture should be assumed with all daily activities, even if they only take a few moments, like when brushing your teeth. As in sitting, your ears should fall over your shoulders and your shoulders should fall over your hips. You should keep a slight tension in your abdominal muscles to brace your spine. Your tailbone should point down to the ground, not behind your body, resulting in an over-extended swayback posture.  INCORRECT STANDING POSTURES  Common incorrect standing postures include a forward head, locked knees and/or an excessive swayback. WALKING Walk with an upright posture. Your ears, shoulders and hips should all line-up. PROLONGED ACTIVITY IN A FLEXED POSITION When completing a task that requires you to bend forward at your waist or lean over a low surface, try to find a way to stabilize 3 of 4 of your limbs. You can place a hand or elbow on your thigh or rest a knee on the surface you are reaching across. This will provide you more stability so that your muscles do not fatigue as quickly. By keeping your knees relaxed, or slightly bent, you will also reduce stress across your low back. CORRECT LIFTING TECHNIQUES DO :   Assume a wide stance. This will provide you more stability and the opportunity to get as close as possible to the object which you are lifting.  Tense your abdominals to brace your spine; then bend at the  knees and hips. Keeping your back locked in a neutral-spine position, lift using your leg muscles. Lift with your legs, keeping your back straight.  Test the weight of unknown objects before attempting to lift them.  Try to keep your elbows locked down at your sides in order get the best strength from your shoulders when carrying an object.  Always ask for help when lifting  heavy or awkward objects. INCORRECT LIFTING TECHNIQUES DO NOT:   Lock your knees when lifting, even if it is a small object.  Bend and twist. Pivot at your feet or move your feet when needing to change directions.  Assume that you cannot safely pick up a paperclip without proper posture.   This information is not intended to replace advice given to you by your health care provider. Make sure you discuss any questions you have with your health care provider.   Document Released: 03/22/2005 Document Revised: 08/06/2014 Document Reviewed: 07/04/2008 Elsevier Interactive Patient Education [employer]2016 [employer]

## 2015-07-14 NOTE — ED Notes (Signed)
Brought in via ems from home s/p fall   States she has been having lower back pain for about 3 days  But noticed some weakness to right leg this am and fell   Pt is 16 weeks preg  Denies any vaginal bleeding or pregnancy problems

## 2015-07-16 LAB — URINE CULTURE: Special Requests: NORMAL

## 2016-06-29 ENCOUNTER — Encounter: Payer: Self-pay | Admitting: *Deleted

## 2016-06-29 ENCOUNTER — Emergency Department
Admission: EM | Admit: 2016-06-29 | Discharge: 2016-06-29 | Disposition: A | Discharge status: Home / Self Care | Payer: Medicaid Other | Attending: Emergency Medicine | Admitting: Emergency Medicine

## 2016-06-29 DIAGNOSIS — Z79899 Other long term (current) drug therapy: Secondary | ICD-10-CM | POA: Insufficient documentation

## 2016-06-29 DIAGNOSIS — N390 Urinary tract infection, site not specified: Secondary | ICD-10-CM | POA: Insufficient documentation

## 2016-06-29 DIAGNOSIS — R109 Unspecified abdominal pain: Secondary | ICD-10-CM | POA: Diagnosis present

## 2016-06-29 LAB — POCT PREGNANCY, URINE: PREG TEST UR: NEGATIVE

## 2016-06-29 LAB — URINALYSIS, COMPLETE (UACMP) WITH MICROSCOPIC
BACTERIA UA: NONE SEEN
BILIRUBIN URINE: NEGATIVE
Glucose, UA: NEGATIVE mg/dL
HGB URINE DIPSTICK: NEGATIVE
Ketones, ur: NEGATIVE mg/dL
NITRITE: NEGATIVE
PH: 6 (ref 5.0–8.0)
Protein, ur: NEGATIVE mg/dL
SPECIFIC GRAVITY, URINE: 1.027 (ref 1.005–1.030)

## 2016-06-29 MED ORDER — PHENAZOPYRIDINE HCL 100 MG PO TABS: 100.0000 mg | ORAL_TABLET | Freq: Three times a day (TID) | ORAL | 0 refills | Status: AC | PRN

## 2016-06-29 MED ORDER — CEPHALEXIN 500 MG PO CAPS: 500.0000 mg | ORAL_CAPSULE | Freq: Three times a day (TID) | ORAL | 0 refills | Status: DC

## 2016-06-29 NOTE — ED Provider Notes (Signed)
[facility] Emergency Department Provider Note   ____________________________________________   First MD Initiated Contact with Patient 06/29/16 [phone]     (approximate)  I have reviewed the triage vital signs and the nursing notes.   HISTORY  Chief Complaint Flank Pain   HPI Abigail Rios is a 26 y.o. female is here with right flank and lateral back pain that began last night. Patient states that she feels like she needs to "pop her back". She denies any radiation of her pain down into her extremities. She is not taking any over-the-counter medication for her pain. She is unaware of any urinary symptoms and denies any history of kidney stones. There is been no history of injury to her back. Patient denies any nausea, vomiting, fever or chills. She rates her pain as 9/10.   Past Medical History:  Diagnosis Date  . Von Willebrand disease (HCC)     There are no active problems to display for this patient.   Past Surgical History:  Procedure Laterality Date  . TONSILLECTOMY      Prior to Admission medications   Medication Sig Start Date End Date Taking? Authorizing Provider  cephALEXin (KEFLEX) 500 MG capsule Take 1 capsule (500 mg total) by mouth 3 (three) times daily. 06/29/16   Tommi Rumpshonda L Amely Voorheis, PA-C  phenazopyridine (PYRIDIUM) 100 MG tablet Take 1 tablet (100 mg total) by mouth 3 (three) times daily as needed for pain. 06/29/16 06/29/17  Tommi Rumpshonda L Enijah Furr, PA-C    Allergies Peanuts [peanut oil] and Aspirin  History reviewed. No pertinent family history.  Social History Social History  Substance Use Topics  . Smoking status: Former Games developermoker  . Smokeless tobacco: Not on file  . Alcohol use No    Review of Systems Constitutional: No fever/chills Cardiovascular: Denies chest pain. Respiratory: Denies shortness of breath. Gastrointestinal: No abdominal pain.  No nausea, no vomiting.   Genitourinary: Negative for dysuria. Musculoskeletal:  Positive for right lateral back pain. Skin: Negative for rash. Neurological: Negative for headaches, focal weakness or numbness.  10-point ROS otherwise negative.  ____________________________________________   PHYSICAL EXAM:  VITAL SIGNS: ED Triage Vitals [06/29/16 0710]  Enc Vitals Group     BP (!) 108/52     Pulse Rate 76     Resp 18     Temp 98.4 F (36.9 C)     Temp Source Oral     SpO2 100 %     Weight 190 lb (86.2 kg)     Height 5\' 5"  (1.651 m)     Head Circumference      Peak Flow      Pain Score 9     Pain Loc      Pain Edu?      Excl. in GC?     Constitutional: Alert and oriented. Well appearing and in no acute distress. Eyes: Conjunctivae are normal. PERRL. EOMI. Head: Atraumatic. Nose: No congestion/rhinnorhea. Neck: No stridor.   Cardiovascular: Normal rate, regular rhythm. Grossly normal heart sounds.  Good peripheral circulation. Respiratory: Normal respiratory effort.  No retractions. Lungs CTAB. Gastrointestinal: Soft and nontender. No distention. No CVA tenderness noted. Musculoskeletal: Moves upper and lower extremities without difficulty. Normal gait was noted. Neurologic:  Normal speech and language. No gross focal neurologic deficits are appreciated. No gait instability. Skin:  Skin is warm, dry and intact. No rash noted. Psychiatric: Mood and affect are normal. Speech and behavior are normal.  ____________________________________________   LABS (all labs ordered are  listed, but only abnormal results are displayed)  Labs Reviewed  URINALYSIS, COMPLETE (UACMP) WITH MICROSCOPIC - Abnormal; Notable for the following:       Result Value   Color, Urine YELLOW (*)    APPearance HAZY (*)    Leukocytes, UA MODERATE (*)    Squamous Epithelial / LPF 6-30 (*)    All other components within normal limits  POC URINE PREG, ED  POCT PREGNANCY, URINE    PROCEDURES  Procedure(s) performed: None  Procedures  Critical Care performed:  No  ____________________________________________   INITIAL IMPRESSION / ASSESSMENT AND PLAN / ED COURSE  Pertinent labs & imaging results that were available during my care of the patient were reviewed by me and considered in my medical decision making (see chart for details).   Patient is to increase fluids and began taking Keflex 500 mg 3 times a day for 10 days. She is also given a prescription for Pyridium 1 3 times a day for the next 3 days. She may also take Tylenol as needed for pain. She is to follow-up with her PCP or [facility] if any continued problems.     ____________________________________________   FINAL CLINICAL IMPRESSION(S) / ED DIAGNOSES  Final diagnoses:  Acute UTI (urinary tract infection)      NEW MEDICATIONS STARTED DURING THIS VISIT:  Discharge Medication List as of 06/29/2016  8:50 AM    START taking these medications   Details  cephALEXin (KEFLEX) 500 MG capsule Take 1 capsule (500 mg total) by mouth 3 (three) times daily., Starting Tue 06/29/2016, Print    phenazopyridine (PYRIDIUM) 100 MG tablet Take 1 tablet (100 mg total) by mouth 3 (three) times daily as needed for pain., Starting Tue 06/29/2016, Until Wed 06/29/2017, Print         Note:  This document was prepared using Dragon voice recognition software and may include unintentional dictation errors.    Tommi Rumps, PA-C 06/29/16 1614    Emily Filbert, MD 06/30/16 [phone]

## 2016-06-29 NOTE — ED Notes (Signed)
See triage note. Developed right flank pain last pm  Pain is non radiating  Afebrile..denies any urinary sxs'

## 2016-06-29 NOTE — ED Triage Notes (Addendum)
States right flank pain that began last night, denies any urinary symptoms, denies any nausea, or diarrhea or abd pain, awake and alert in no acute distress

## 2016-06-29 NOTE — Discharge Instructions (Signed)
Increase fluids. Begin taking antibiotics for the next 10 days. Keflex 500 mg 3 times a day. Take Pyridium one 3 times a day for 3 days. This medication will help with pain. You may also take Tylenol if needed for pain with this medication.

## 2016-07-03 ENCOUNTER — Emergency Department
Admission: EM | Admit: 2016-07-03 | Discharge: 2016-07-03 | Disposition: A | Discharge status: Home / Self Care | Payer: Medicaid Other | Attending: Emergency Medicine | Admitting: Emergency Medicine

## 2016-07-03 DIAGNOSIS — J111 Influenza due to unidentified influenza virus with other respiratory manifestations: Secondary | ICD-10-CM | POA: Diagnosis not present

## 2016-07-03 DIAGNOSIS — Z87891 Personal history of nicotine dependence: Secondary | ICD-10-CM | POA: Insufficient documentation

## 2016-07-03 DIAGNOSIS — J3489 Other specified disorders of nose and nasal sinuses: Secondary | ICD-10-CM | POA: Diagnosis present

## 2016-07-03 MED ORDER — OSELTAMIVIR PHOSPHATE 75 MG PO CAPS: 75.0000 mg | ORAL_CAPSULE | Freq: Two times a day (BID) | ORAL | 0 refills | Status: AC

## 2016-07-03 NOTE — ED Provider Notes (Signed)
[facility] Emergency Department Provider Note  ____________________________________________  Time seen: Approximately 10:43 PM  I have reviewed the triage vital signs and the nursing notes.   HISTORY  Chief Complaint Influenza    HPI Abigail Rios is a 26 y.o. female presents to the emergency department with headache, congestion, rhinorrhea, fatigue, myalgias and diarrhea for the past 2 days. She has not evaluated her temperature. However, she has had chills. Patient has also experienced increased sleep and diminished appetite. She is tolerating fluids by mouth. No recent travel. Patient has numerous sick contacts in her place of work. Patient denies chest pain, chest tightness, shortness of breath, abdominal pain, nausea and vomiting. Patient has taken Tylenol but no other alleviating measures have been attempted.    Past Medical History:  Diagnosis Date  . Von Willebrand disease (HCC)     There are no active problems to display for this patient.   Past Surgical History:  Procedure Laterality Date  . TONSILLECTOMY      Prior to Admission medications   Medication Sig Start Date End Date Taking? Authorizing Provider  cephALEXin (KEFLEX) 500 MG capsule Take 1 capsule (500 mg total) by mouth 3 (three) times daily. 06/29/16   Tommi Rumps, PA-C  oseltamivir (TAMIFLU) 75 MG capsule Take 1 capsule (75 mg total) by mouth 2 (two) times daily. 07/03/16 07/08/16  Orvil Feil, PA-C  phenazopyridine (PYRIDIUM) 100 MG tablet Take 1 tablet (100 mg total) by mouth 3 (three) times daily as needed for pain. 06/29/16 06/29/17  Tommi Rumps, PA-C    Allergies Peanuts [peanut oil] and Aspirin  No family history on file.  Social History Social History  Substance Use Topics  . Smoking status: Former Games developer  . Smokeless tobacco: Not on file  . Alcohol use No     Review of Systems  Constitutional: Patient has had chills Eyes: No visual changes. No  discharge ENT: Patient has had congestion.  Cardiovascular: no chest pain. Respiratory: Patient has had non-productive cough.  No SOB. Gastrointestinal: Patient has had nausea.  Genitourinary: Negative for dysuria. No hematuria Musculoskeletal: Patient has had myalgias. Skin: Negative for rash, abrasions, lacerations, ecchymosis. Neurological: Patient has had headache, no focal weakness or numbness.  ____________________________________________   PHYSICAL EXAM:  VITAL SIGNS: ED Triage Vitals [07/03/16 2016]  Enc Vitals Group     BP 116/75     Pulse Rate 100     Resp 18     Temp 99.4 F (37.4 C)     Temp Source Oral     SpO2 100 %     Weight 190 lb (86.2 kg)     Height  (1.651 m)     Head Circumference      Peak Flow      Pain Score 9     Pain Loc      Pain Edu?      Excl. in GC?     Constitutional: Alert and oriented. Patient is lying supine in bed.  Eyes: Conjunctivae are normal. PERRL. EOMI. Head: Atraumatic. ENT:      Ears: Tympanic membranes are injected bilaterally without evidence of effusion or purulent exudate. Bony landmarks are visualized bilaterally. No pain with palpation at the tragus.      Nose: Nasal turbinates are edematous and erythematous. Copious rhinorrhea visualized.      Mouth/Throat: Mucous membranes are moist. Posterior pharynx is mildly erythematous. No tonsillar hypertrophy or purulent exudate. Uvula is midline. Neck: Full  range of motion. No pain is elicited with flexion at the neck. Hematological/Lymphatic/Immunilogical: No cervical lymphadenopathy. Cardiovascular: Normal rate, regular rhythm. Normal S1 and S2.  Good peripheral circulation. Respiratory: Normal respiratory effort without tachypnea or retractions. Lungs CTAB. Good air entry to the bases with no decreased or absent breath sounds. Gastrointestinal: Bowel sounds 4 quadrants. Soft and nontender to palpation. No guarding or rigidity. No palpable masses. No distention. No CVA  tenderness.  Skin:  Skin is warm, dry and intact. No rash noted. Psychiatric: Mood and affect are normal. Speech and behavior are normal. Patient exhibits appropriate insight and judgement.  ____________________________________________   LABS (all labs ordered are listed, but only abnormal results are displayed)  Labs Reviewed - No data to display ____________________________________________  EKG   ____________________________________________  RADIOLOGY   No results found.  ____________________________________________    PROCEDURES  Procedure(s) performed:    Procedures    Medications - No data to display   ____________________________________________   INITIAL IMPRESSION / ASSESSMENT AND PLAN / ED COURSE  Pertinent labs & imaging results that were available during my care of the patient were reviewed by me and considered in my medical decision making (see chart for details).  Review of the [place] CSRS was performed in accordance of the [facility] prior to dispensing any controlled drugs.     Assessment and Plan:  Influenza: Patient presents to the emergency department with headache, rhinorrhea, congestion, myalgias, fatigue and diarrhea. Symptoms are consistent with influenza. Tamiflu was prescribed at discharge. Rest and hydration were encouraged. Patient was advised to follow-up with her primary care provider in one week. Physical exam and vital signs are reassuring at this time. All patient questions were answered.   ____________________________________________  FINAL CLINICAL IMPRESSION(S) / ED DIAGNOSES  Final diagnoses:  Influenza      NEW MEDICATIONS STARTED DURING THIS VISIT:  New Prescriptions   OSELTAMIVIR (TAMIFLU) 75 MG CAPSULE    Take 1 capsule (75 mg total) by mouth 2 (two) times daily.        This chart was dictated using voice recognition software/Dragon. Despite best efforts to proofread, errors can occur which can change the meaning.  Any change was purely unintentional.    Orvil Feil, PA-C 07/03/16 2358    Jene Every, MD 07/04/16 [phone]

## 2016-07-03 NOTE — ED Notes (Signed)
Upon assessment pt reports feeling "flu like symptoms."

## 2016-07-03 NOTE — ED Triage Notes (Signed)
Patient reports diagnosed eariler in the week with UTI.  Reports last night began with cold symptoms, chills and cough.

## 2016-08-03 ENCOUNTER — Encounter: Payer: Self-pay | Admitting: Medical Oncology

## 2016-08-03 ENCOUNTER — Emergency Department
Admission: EM | Admit: 2016-08-03 | Discharge: 2016-08-03 | Disposition: A | Discharge status: Home / Self Care | Payer: Medicaid Other | Attending: Emergency Medicine | Admitting: Emergency Medicine

## 2016-08-03 DIAGNOSIS — S00212A Abrasion of left eyelid and periocular area, initial encounter: Secondary | ICD-10-CM | POA: Insufficient documentation

## 2016-08-03 DIAGNOSIS — Y999 Unspecified external cause status: Secondary | ICD-10-CM | POA: Insufficient documentation

## 2016-08-03 DIAGNOSIS — Z87891 Personal history of nicotine dependence: Secondary | ICD-10-CM | POA: Insufficient documentation

## 2016-08-03 DIAGNOSIS — Y9389 Activity, other specified: Secondary | ICD-10-CM | POA: Insufficient documentation

## 2016-08-03 DIAGNOSIS — Y929 Unspecified place or not applicable: Secondary | ICD-10-CM | POA: Insufficient documentation

## 2016-08-03 DIAGNOSIS — W2209XA Striking against other stationary object, initial encounter: Secondary | ICD-10-CM | POA: Insufficient documentation

## 2016-08-03 DIAGNOSIS — Z79899 Other long term (current) drug therapy: Secondary | ICD-10-CM | POA: Insufficient documentation

## 2016-08-03 DIAGNOSIS — S0081XA Abrasion of other part of head, initial encounter: Secondary | ICD-10-CM

## 2016-08-03 DIAGNOSIS — Z23 Encounter for immunization: Secondary | ICD-10-CM | POA: Insufficient documentation

## 2016-08-03 MED ORDER — ACETAMINOPHEN 325 MG PO TABS
650.0000 mg | ORAL_TABLET | Freq: Once | ORAL | Status: AC
  Administered 2016-08-03: 650 mg via ORAL
  Filled 2016-08-03: qty 2

## 2016-08-03 MED ORDER — TETANUS-DIPHTH-ACELL PERTUSSIS 5-2.5-18.5 LF-MCG/0.5 IM SUSP
0.5000 mL | Freq: Once | INTRAMUSCULAR | Status: AC
  Administered 2016-08-03: 0.5 mL via INTRAMUSCULAR
  Filled 2016-08-03: qty 0.5

## 2016-08-03 NOTE — ED Provider Notes (Signed)
[facility] Emergency Department Provider Note  ____________________________________________  Time seen: Approximately 3:55 PM  I have reviewed the triage vital signs and the nursing notes.   HISTORY  Chief Complaint Laceration   HPI Abigail Rios is a 26 y.o. female presenting to the emergency department with a superficial scalp abrasion of the skin overlying the left eyebrow. Patient states that she raised her head up against a clothes rack. Patient experienced no loss of consciousness. Patient did not fall. She denies blurry vision, vertigo, nausea, vomiting and abdominal pain. Hemostasis had been achieved prior to presenting to the emergency department. Patient cannot recall her last tetanus shot.   Past Medical History:  Diagnosis Date  . Von Willebrand disease (HCC)     There are no active problems to display for this patient.   Past Surgical History:  Procedure Laterality Date  . TONSILLECTOMY      Prior to Admission medications   Medication Sig Start Date End Date Taking? Authorizing Provider  cephALEXin (KEFLEX) 500 MG capsule Take 1 capsule (500 mg total) by mouth 3 (three) times daily. 06/29/16   Tommi Rumps, PA-C  phenazopyridine (PYRIDIUM) 100 MG tablet Take 1 tablet (100 mg total) by mouth 3 (three) times daily as needed for pain. 06/29/16 06/29/17  Tommi Rumps, PA-C    Allergies Peanuts [peanut oil] and Aspirin  No family history on file.  Social History Social History  Substance Use Topics  . Smoking status: Former Games developer  . Smokeless tobacco: Not on file  . Alcohol use No    Review of Systems  Constitutional: No fever/chills Eyes: No visual changes. No discharge ENT: No upper respiratory complaints. Cardiovascular: no chest pain. Respiratory: no cough. No SOB. Gastrointestinal: No abdominal pain.  No nausea, no vomiting.  No diarrhea.  No constipation. Musculoskeletal: Negative for musculoskeletal pain.  Skin:  Patient has superficial abrasion of left eyebrow. Neurological: Negative for headaches, focal weakness or numbness. ____________________________________________   PHYSICAL EXAM:  VITAL SIGNS: ED Triage Vitals  Enc Vitals Group     BP 08/03/16 1538 125/90     Pulse Rate 08/03/16 1538 81     Resp 08/03/16 1538 18     Temp 08/03/16 1538 98.6 F (37 C)     Temp Source 08/03/16 1538 Oral     SpO2 08/03/16 1538 100 %     Weight 08/03/16 1537 192 lb (87.1 kg)     Height 08/03/16 1537  (1.651 m)     Head Circumference --      Peak Flow --      Pain Score 08/03/16 1537 10     Pain Loc --      Pain Edu? --      Excl. in GC? --     Constitutional: Alert and oriented. Well appearing and in no acute distress. Eyes: Conjunctivae are normal. PERRL. EOMI. Head: Atraumatic. Cardiovascular: Normal rate, regular rhythm. Normal S1 and S2.  Good peripheral circulation. Respiratory: Normal respiratory effort without tachypnea or retractions. Lungs CTAB. Good air entry.  Musculoskeletal: Full range of motion to all extremities. No gross deformities appreciated. Neurologic:  Normal speech and language. No gross focal neurologic deficits are appreciated.  Skin:  Patient has superficial abrasion overlying left eyebrow. Psychiatric: Mood and affect are normal. Speech and behavior are normal. Patient exhibits appropriate insight and judgement.  ____________________________________________   LABS (all labs ordered are listed, but only abnormal results are displayed)  Labs Reviewed - No  data to display ____________________________________________  EKG   ____________________________________________  RADIOLOGY   No results found.  ____________________________________________    PROCEDURES  Procedure(s) performed:    Procedures    Medications  Tdap (BOOSTRIX) injection 0.5 mL (not administered)  acetaminophen (TYLENOL) tablet 650 mg (not administered)      ____________________________________________   INITIAL IMPRESSION / ASSESSMENT AND PLAN / ED COURSE  Pertinent labs & imaging results that were available during my care of the patient were reviewed by me and considered in my medical decision making (see chart for details).  Review of the [place] CSRS was performed in accordance of the [facility] prior to dispensing any controlled drugs.     Assessment and plan: Superficial Left Eyebrow Abrasion:  Patient presents to the emergency department with a superficial left eyebrow abrasion. Basic wound care was provided in the emergency department. Patient's tetanus status was updated. Tylenol was given in the emergency department. Vital signs were reassuring prior to discharge. All patient questions were answered. ____________________________________________  FINAL CLINICAL IMPRESSION(S) / ED DIAGNOSES  Final diagnoses:  Abrasion of face, initial encounter      NEW MEDICATIONS STARTED DURING THIS VISIT:  New Prescriptions   No medications on file        This chart was dictated using voice recognition software/Dragon. Despite best efforts to proofread, errors can occur which can change the meaning. Any change was purely unintentional.    Orvil Feil, PA-C 08/03/16 1611    Merrily Brittle, MD 08/03/16 2234

## 2016-08-03 NOTE — ED Triage Notes (Signed)
Pt reports she was looking for her child under some clothes rack when she raised up and cut her left eyebrow on rack. Denies LOC.

## 2016-08-03 NOTE — ED Notes (Signed)
See triage note. Hit head on metal bar  Small laceration noted to left eyebrow  Bleeding controlled

## 2016-11-24 ENCOUNTER — Encounter: Payer: Self-pay | Admitting: Emergency Medicine

## 2016-11-24 ENCOUNTER — Emergency Department
Admission: EM | Admit: 2016-11-24 | Discharge: 2016-11-24 | Disposition: A | Discharge status: Home / Self Care | Payer: Self-pay | Attending: Emergency Medicine | Admitting: Emergency Medicine

## 2016-11-24 DIAGNOSIS — Z87891 Personal history of nicotine dependence: Secondary | ICD-10-CM | POA: Insufficient documentation

## 2016-11-24 DIAGNOSIS — Z91010 Allergy to peanuts: Secondary | ICD-10-CM | POA: Insufficient documentation

## 2016-11-24 DIAGNOSIS — B86 Scabies: Secondary | ICD-10-CM | POA: Insufficient documentation

## 2016-11-24 MED ORDER — PERMETHRIN 5 % EX CREA: 1.0000 "application " | TOPICAL_CREAM | Freq: Once | CUTANEOUS | 1 refills | Status: DC

## 2016-11-24 MED ORDER — HYDROXYZINE HCL 25 MG PO TABS: 25.0000 mg | ORAL_TABLET | Freq: Three times a day (TID) | ORAL | 0 refills | Status: AC | PRN

## 2016-11-24 MED ORDER — PERMETHRIN 5 % EX CREA: 1.0000 "application " | TOPICAL_CREAM | Freq: Once | CUTANEOUS | 1 refills | Status: AC

## 2016-11-24 MED ORDER — HYDROXYZINE HCL 25 MG PO TABS: 25.0000 mg | ORAL_TABLET | Freq: Three times a day (TID) | ORAL | 0 refills | Status: DC | PRN

## 2016-11-24 NOTE — ED Triage Notes (Signed)
Presents with rash to left hand,mainly around ring finger  Also has another area to buttocks

## 2016-11-24 NOTE — ED Provider Notes (Signed)
[facility] Emergency Department Provider Note   ____________________________________________   I have reviewed the triage vital signs and the nursing notes.   HISTORY  Chief Complaint Rash    HPI Abigail Rios is a 26 y.o. female presents emergency Department with a a rash in the web space of the left fourth and fifth digit and the right middle digit of the hands. In addition, she notes the same rash along the right posterior hip region that has persisted for 2 weeks. The rash appears as small erythematous papules with excoriation. She notes rash is pruritic in nature. Patient noted the rash initially developed on the left hand and progressed. Patient has tried over-the-counter cortisone creams and Neosporin ointment and Benadryl without relief. Patient has no known contact r environment allergies to her  Knowledge. 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   Patient denies fever, chills, headache, vision changes, chest pain, chest tightness, shortness of breath, abdominal pain, nausea and vomiting.  Past Medical History:  Diagnosis Date  . Von Willebrand disease (HCC)     There are no active problems to display for this patient.   Past Surgical History:  Procedure Laterality Date  . TONSILLECTOMY      Prior to Admission medications   Medication Sig Start Date End Date Taking? Authorizing Provider  cephALEXin (KEFLEX) 500 MG capsule Take 1 capsule (500 mg total) by mouth 3 (three) times daily. 06/29/16   Tommi Rumps, PA-C  hydrOXYzine (ATARAX/VISTARIL) 25 MG tablet Take 1 tablet (25 mg total) by mouth 3 (three) times daily as needed. 11/24/16   Alaura Schippers M, PA-C  permethrin (ELIMITE) 5 % cream Apply 1 application topically once. Apply cream over the affected areaand leave on overnight (at least  8-10 hours later). The cream maybe removed by showering or bathing. 11/24/16 11/24/16  Reiley Keisler M, PA-C  phenazopyridine (PYRIDIUM) 100 MG tablet Take 1 tablet (100 mg total) by mouth 3 (three) times daily as needed for pain. 06/29/16 06/29/17  Tommi Rumps, PA-C    Allergies Peanuts [peanut oil] and Aspirin  No family history on file.  Social History Social History  Substance Use Topics  . Smoking status: Former Games developer  . Smokeless tobacco: Never Used  . Alcohol use No    Review of Systems Constitutional: Negative for fever/chills Eyes: No visual changes. ENT:  Negative for sore throat and for difficulty swallowing Cardiovascular: Denies chest pain. Respiratory: Denies cough. Denies shortness of breath. Gastrointestinal: No abdominal pain.  No nausea, vomiting, diarrhea. Genitourinary: Negative for dysuria. Musculoskeletal: Negative for back pain. Skin: Positive for rash along bilateral fingers and right posterior hip.  Neurological: Negative for headaches.  Negative focal weakness or numbness. Negative for loss of consciousness. Able to ambulate. ____________________________________________   PHYSICAL EXAM:  VITAL SIGNS: ED Triage Vitals  Enc Vitals Group     BP 11/24/16 0958 109/65     Pulse Rate 11/24/16 0958 86     Resp 11/24/16 0958 18     Temp 11/24/16 0958 98.5 F (36.9 C)     Temp Source 11/24/16 0958 Oral     SpO2 11/24/16 0958 99 %     Weight 11/24/16 0959 190 lb (86.2 kg)     Height 11/24/16 0959 5\' 6"  (1.676 m)     Head Circumference --  Peak Flow --      Pain Score --      Pain Loc --      Pain Edu? --      Excl. in GC? --     Constitutional: Alert and oriented. Well appearing and in no acute distress.  Eyes: Conjunctivae are normal. PERRL. EOMI  Head: Normocephalic and atraumatic. ENT:      Ears: Canals clear. TMs intact bilaterally.      Nose: No congestion/rhinnorhea.      Mouth/Throat: Mucous membranes are moist. Neck:Supple. No  thyromegaly. No stridor.  Cardiovascular: Normal rate, regular rhythm. Normal S1 and S2.  Good peripheral circulation. Respiratory: Normal respiratory effort without tachypnea or retractions. Lungs CTAB. No wheezes/rales/rhonchi.  Hematological/Lymphatic/Immunological: No cervical lymphadenopathy. Cardiovascular: Normal rate, regular rhythm. Normal distal pulses. Gastrointestinal: Bowel sounds 4 quadrants. Soft and nontender to palpation.Musculoskeletal: Nontender with normal range of motion in all extremities. Neurologic: Normal speech and language. No gross focal neurologic deficits are appreciated. No gait instability.  Skin:  Skin is warm, dry and intact. Rash along webspace of the 4th and 5th digit of the left hand and middle digit of the right hand and right posterior hip. Small erythematous papules with excoriation. Psychiatric: Mood and affect are normal. Speech and behavior are normal. Patient exhibits appropriate insight and judgement.  ____________________________________________   LABS (all labs ordered are listed, but only abnormal results are displayed)  Labs Reviewed - No data to display ____________________________________________  EKG none ____________________________________________  RADIOLOGY none ____________________________________________   PROCEDURES  Procedure(s) performed: no    Critical Care performed: no ____________________________________________   INITIAL IMPRESSION / ASSESSMENT AND PLAN / ED COURSE  Pertinent labs & imaging results that were available during my care of the patient were reviewed by me and considered in my medical decision making (see chart for details).  Patient presents to emergency department with a rash along webspace of the 4th and 5th digit of the left hand and middle digit of the right hand and right posterior hip.Marland Kitchen History and physical exam findings are reassuring rash is consistent with scabies. Patient will be  prescribed permethrin cream and hydroxyzine as needed for itching. Patient advised to follow up with PCP as needed or return to the emergency department if symptoms return or worsen.   ____________________________________________   FINAL CLINICAL IMPRESSION(S) / ED DIAGNOSES  Final diagnoses:  Scabies       NEW MEDICATIONS STARTED DURING THIS VISIT:  Discharge Medication List as of 11/24/2016 10:32 AM       Note:  This document was prepared using Dragon voice recognition software and may include unintentional dictation errors.    Clois Comber, PA-C 11/24/16 1049    Dinita Migliaccio, Jordan Likes, PA-C 11/24/16 1345    Rockne Menghini, MD 11/24/16 [phone]

## 2016-12-07 ENCOUNTER — Emergency Department
Admission: EM | Admit: 2016-12-07 | Discharge: 2016-12-07 | Disposition: A | Discharge status: Home / Self Care | Payer: Self-pay | Attending: Emergency Medicine | Admitting: Emergency Medicine

## 2016-12-07 DIAGNOSIS — Z91010 Allergy to peanuts: Secondary | ICD-10-CM | POA: Insufficient documentation

## 2016-12-07 DIAGNOSIS — R21 Rash and other nonspecific skin eruption: Secondary | ICD-10-CM | POA: Insufficient documentation

## 2016-12-07 DIAGNOSIS — Z87891 Personal history of nicotine dependence: Secondary | ICD-10-CM | POA: Insufficient documentation

## 2016-12-07 DIAGNOSIS — Z79899 Other long term (current) drug therapy: Secondary | ICD-10-CM | POA: Insufficient documentation

## 2016-12-07 MED ORDER — TRIAMCINOLONE ACETONIDE 0.1 % EX CREA: 1.0000 "application " | TOPICAL_CREAM | Freq: Two times a day (BID) | CUTANEOUS | 0 refills | Status: AC

## 2016-12-07 NOTE — ED Triage Notes (Signed)
Pt c/o rash for over 2 weeks on the left hand that she was for recently and states it is not getting any better..Marland Kitchen

## 2016-12-07 NOTE — Discharge Instructions (Signed)
In addition to prescribed topical prescription cream you may use emollient type creams such as Aquaphor or Eucerin ointment.

## 2016-12-07 NOTE — ED Provider Notes (Signed)
[facility] Emergency Department Provider Note   ____________________________________________   I have reviewed the triage vital signs and the nursing notes.   HISTORY  Chief Complaint Rash    HPI Abigail Rios is a 26 y.o. female presents to the emergency department with persisting rash along the webspace of the fourth and fifth digits of the left hand. Patient was previously seen on 11/24/2016 and treated for scabies. Patient noted resolution of rash along the torso and forearm however, rash along the fingers has persisted. Patient reports pain and itching her her primary complaints this time. Patient denies fever, chills, headache, vision changes, chest pain, chest tightness, shortness of breath, abdominal pain, nausea and vomiting.  Past Medical History:  Diagnosis Date  . Von Willebrand disease (HCC)     There are no active problems to display for this patient.   Past Surgical History:  Procedure Laterality Date  . TONSILLECTOMY      Prior to Admission medications   Medication Sig Start Date End Date Taking? Authorizing Provider  cephALEXin (KEFLEX) 500 MG capsule Take 1 capsule (500 mg total) by mouth 3 (three) times daily. 06/29/16   Tommi RumpsSummers, Rhonda L, PA-C  hydrOXYzine (ATARAX/VISTARIL) 25 MG tablet Take 1 tablet (25 mg total) by mouth 3 (three) times daily as needed. 11/24/16   Sade Mehlhoff M, PA-C  phenazopyridine (PYRIDIUM) 100 MG tablet Take 1 tablet (100 mg total) by mouth 3 (three) times daily as needed for pain. 06/29/16 06/29/17  Tommi RumpsSummers, Rhonda L, PA-C  triamcinolone cream (KENALOG) 0.1 % Apply 1 application topically 2 (two) times daily. 12/07/16 12/14/16  Anahi Belmar M, PA-C    Allergies Peanuts [peanut oil] and Aspirin  No family history on file.  Social History Social History  Substance Use Topics  . Smoking status: Former Games developermoker  . Smokeless tobacco: Never Used  . Alcohol use No    Review of Systems Constitutional:  Negative for fever/chills Eyes: No visual changes. ENT:  Negative for sore throat and for difficulty swallowing Cardiovascular: Denies chest pain. Respiratory: Denies cough. Denies shortness of breath. Gastrointestinal: No abdominal pain.  No nausea, vomiting, diarrhea. Genitourinary: Negative for dysuria. Musculoskeletal: Negative for back pain. Skin: Positive for rash on the webspace of the fourth and fifth digits of the left hand. Neurological: Negative for headaches.   ____________________________________________   PHYSICAL EXAM:  VITAL SIGNS: ED Triage Vitals  Enc Vitals Group     BP 12/07/16 1547 (!) 144/74     Pulse Rate 12/07/16 1547 66     Resp 12/07/16 1547 17     Temp 12/07/16 1547 98.4 F (36.9 C)     Temp Source 12/07/16 1547 Oral     SpO2 12/07/16 1547 99 %     Weight 12/07/16 1547 200 lb (90.7 kg)     Height 12/07/16 1547 5\' 6"  (1.676 m)     Head Circumference --      Peak Flow --      Pain Score 12/07/16 1546 8     Pain Loc --      Pain Edu? --      Excl. in GC? --     Constitutional: Alert and oriented. Well appearing and in no acute distress.  Eyes: Conjunctivae are normal. PERRL. EOMI  Head: Normocephalic and atraumatic. ENT:      Ears: Canals clear. TMs intact bilaterally.      Nose: No congestion/rhinnorhea.      Mouth/Throat: Mucous membranes are moist.  Neck:Supple. No thyromegaly. No stridor.  Cardiovascular: Normal rate, regular rhythm. Normal S1 and S2.  Good peripheral circulation. Respiratory: Normal respiratory effort without tachypnea or retractions. Lungs CTAB. No wheezes/rales/rhonchi.  Hematological/Lymphatic/Immunological: No cervical lymphadenopathy. Cardiovascular: Normal rate, regular rhythm. Normal distal pulses. Gastrointestinal: Bowel sounds 4 quadrants. Soft and nontender to palpation.  Musculoskeletal: Nontender with normal range of motion in all extremities. Neurologic: Normal speech and language.  Skin:  Skin is warm, dry  and intact. Erythematous, scaling rash with excoriation and lichenification along webspace of the 4th and 5th digit of the left hand. Psychiatric: Mood and affect are normal. Speech and behavior are normal. Patient exhibits appropriate insight and judgement.  ____________________________________________   LABS (all labs ordered are listed, but only abnormal results are displayed)  Labs Reviewed - No data to display ____________________________________________  EKG none ____________________________________________  RADIOLOGY none ____________________________________________   PROCEDURES  Procedure(s) performed: no    Critical Care performed: no ____________________________________________   INITIAL IMPRESSION / ASSESSMENT AND PLAN / ED COURSE  Pertinent labs & imaging results that were available during my care of the patient were reviewed by me and considered in my medical decision making (see chart for details).  Patient presents to emergency department with a rash along webspace of the 4th and 5th digit of the left hand. History and physical exam findings are reassuring remaining rash symptoms associated with secondary lesion symptoms; lichenification and excoriation. Patient will be prescribed triamcinolone cream and recommended to use emollient type creams on her hands until symptoms improve.  Patient advised to follow up with dermatology if symptoms do not resolve or return to the emergency department if symptoms significantly worsen.    _________   FINAL CLINICAL IMPRESSION(S) / ED DIAGNOSES  Final diagnoses:  Rash and nonspecific skin eruption       NEW MEDICATIONS STARTED DURING THIS VISIT:  Discharge Medication List as of 12/07/2016  4:38 PM    START taking these medications   Details  triamcinolone cream (KENALOG) 0.1 % Apply 1 application topically 2 (two) times daily., Starting Tue 12/07/2016, Until Tue 12/14/2016, Print         Note:  This  document was prepared using Dragon voice recognition software and may include unintentional dictation errors.    Clois Comber, PA-C 12/07/16 1706    Jene Every, MD 12/10/16 [phone]

## 2016-12-07 NOTE — ED Notes (Signed)
[id number].  Skin warm and dry. NAD

## 2016-12-07 NOTE — ED Notes (Signed)
See triage note  States she was seen about 2 weeks ago for rash to left hand    States she finished the meds and rash is not any better  Rash noted to left 4 th and 5 th fingers

## 2017-07-25 ENCOUNTER — Emergency Department
Admission: EM | Admit: 2017-07-25 | Discharge: 2017-07-25 | Disposition: A | Discharge status: Home / Self Care | Payer: Self-pay | Attending: Emergency Medicine | Admitting: Emergency Medicine

## 2017-07-25 ENCOUNTER — Encounter: Payer: Self-pay | Admitting: Emergency Medicine

## 2017-07-25 ENCOUNTER — Other Ambulatory Visit: Payer: Self-pay

## 2017-07-25 DIAGNOSIS — Z91010 Allergy to peanuts: Secondary | ICD-10-CM | POA: Insufficient documentation

## 2017-07-25 DIAGNOSIS — J020 Streptococcal pharyngitis: Secondary | ICD-10-CM | POA: Insufficient documentation

## 2017-07-25 DIAGNOSIS — Z87891 Personal history of nicotine dependence: Secondary | ICD-10-CM | POA: Insufficient documentation

## 2017-07-25 LAB — GROUP A STREP BY PCR: GROUP A STREP BY PCR: DETECTED — AB

## 2017-07-25 MED ORDER — AMOXICILLIN 500 MG PO CAPS: 500.0000 mg | ORAL_CAPSULE | Freq: Three times a day (TID) | ORAL | 0 refills | Status: DC

## 2017-07-25 MED ORDER — DIPHENHYDRAMINE HCL 12.5 MG/5ML PO SYRP: 12.5000 mg | ORAL_SOLUTION | Freq: Four times a day (QID) | ORAL | 0 refills | Status: AC | PRN

## 2017-07-25 MED ORDER — LIDOCAINE VISCOUS 2 % MT SOLN: 5.0000 mL | Freq: Four times a day (QID) | OROMUCOSAL | 0 refills | Status: DC | PRN

## 2017-07-25 NOTE — ED Triage Notes (Signed)
Pt presents to ED via POV c/o sore throat and generalized body aches x1 week. Pt denies cough or nasal congestion.

## 2017-07-25 NOTE — ED Provider Notes (Signed)
[facility] Emergency Department Provider Note   ____________________________________________   First MD Initiated Contact with Patient 07/25/17 1447     (approximate)  I have reviewed the triage vital signs and the nursing notes.   HISTORY  Chief Complaint Sore Throat    HPI Abigail Rios is a 27 y.o. female patient complained of sore throat generalized body aches for 1 week.  Patient denies cough or nasal congestion.  Patient the pain increased with swallowing but can tolerate food and fluids.  Patient rates pain as a 10/10.  Patient described the pain is "sore".  No relief with over-the-counter preparations.  Patient is status post tonsillectomy.  Past Medical History:  Diagnosis Date  . Von Willebrand disease (HCC)     There are no active problems to display for this patient.   Past Surgical History:  Procedure Laterality Date  . TONSILLECTOMY      Prior to Admission medications   Medication Sig Start Date End Date Taking? Authorizing Provider  amoxicillin (AMOXIL) 500 MG capsule Take 1 capsule (500 mg total) by mouth 3 (three) times daily. 07/25/17   Joni Reining, PA-C  cephALEXin (KEFLEX) 500 MG capsule Take 1 capsule (500 mg total) by mouth 3 (three) times daily. 06/29/16   Tommi Rumps, PA-C  diphenhydrAMINE (BENYLIN) 12.5 MG/5ML syrup Take 5 mLs (12.5 mg total) by mouth 4 (four) times daily as needed for allergies. Mix with 5 mL of viscous lidocaine for swish and swallow. 07/25/17   Joni Reining, PA-C  hydrOXYzine (ATARAX/VISTARIL) 25 MG tablet Take 1 tablet (25 mg total) by mouth 3 (three) times daily as needed. 11/24/16   Little, Traci M, PA-C  lidocaine (XYLOCAINE) 2 % solution Use as directed 5 mLs in the mouth or throat every 6 (six) hours as needed for mouth pain. Mix with 5 mL of Benadryl for swish and swallow. 07/25/17   Joni Reining, PA-C    Allergies Peanuts [peanut oil] and Aspirin  History reviewed. No pertinent  family history.  Social History Social History   Tobacco Use  . Smoking status: Former Games developer  . Smokeless tobacco: Never Used  Substance Use Topics  . Alcohol use: No  . Drug use: No    Review of Systems Constitutional: No fever/chills Eyes: No visual changes. ENT: No sore throat. Cardiovascular: Denies chest pain. Respiratory: Denies shortness of breath. Gastrointestinal: No abdominal pain.  No nausea, no vomiting.  No diarrhea.  No constipation. Genitourinary: Negative for dysuria. Musculoskeletal: Negative for back pain. Skin: Negative for rash. Neurological: Negative for headaches, focal weakness or numbness. Hematological/Lymphatic:Von Willebrand disease. Allergic/Immunilogical: Peanuts and aspirin.  ____________________________________________   PHYSICAL EXAM:  VITAL SIGNS: ED Triage Vitals  Enc Vitals Group     BP 07/25/17 1433 114/69     Pulse Rate 07/25/17 1433 90     Resp 07/25/17 1433 18     Temp 07/25/17 1433 99.3 F (37.4 C)     Temp Source 07/25/17 1433 Oral     SpO2 07/25/17 1433 100 %     Weight --      Height 07/25/17 1444 5\' 6"  (1.676 m)     Head Circumference --      Peak Flow --      Pain Score 07/25/17 1444 10     Pain Loc --      Pain Edu? --      Excl. in GC? --     Constitutional: Alert and oriented.  Well appearing and in no acute distress.  Mouth/Throat: Mucous membranes are moist.  Oropharynx non-erythematous. Neck: No stridor.  Hematological/Lymphatic/Immunilogical: Bilateral cervical lymphadenopathy. Cardiovascular: Normal rate, regular rhythm. Grossly normal heart sounds.  Good peripheral circulation. Respiratory: Normal respiratory effort.  No retractions. Lungs CTAB. Musculoskeletal: No lower extremity tenderness nor edema.  No joint effusions. Neurologic:  Normal speech and language. No gross focal neurologic deficits are appreciated. No gait instability. Skin:  Skin is warm, dry and intact. No rash noted. Psychiatric:  Mood and affect are normal. Speech and behavior are normal.  ____________________________________________   LABS (all labs ordered are listed, but only abnormal results are displayed)  Labs Reviewed  GROUP A STREP BY PCR - Abnormal; Notable for the following components:      Result Value   Group A Strep by PCR DETECTED (*)    All other components within normal limits   ____________________________________________  EKG   ____________________________________________  RADIOLOGY  ED MD interpretation:    Official radiology report(s): No results found.  ____________________________________________   PROCEDURES  Procedure(s) performed: None  Procedures  Critical Care performed: No  ____________________________________________   INITIAL IMPRESSION / ASSESSMENT AND PLAN / ED COURSE  As part of my medical decision making, I reviewed the following data within the electronic MEDICAL RECORD NUMBER   Sore throat secondary to strep pharyngitis.  Discussed lab results with patient.  Patient given discharge care instruction.  Patient advised take medication as directed.  Patient given a work note and advised [facility] no [facility] in 3-5 days.      ____________________________________________   FINAL CLINICAL IMPRESSION(S) / ED DIAGNOSES  Final diagnoses:  Strep pharyngitis     ED Discharge Orders        Ordered    amoxicillin (AMOXIL) 500 MG capsule  3 times daily     07/25/17 1538    lidocaine (XYLOCAINE) 2 % solution  Every 6 hours PRN     07/25/17 1538    diphenhydrAMINE (BENYLIN) 12.5 MG/5ML syrup  4 times daily PRN     07/25/17 1538       Note:  This document was prepared using Dragon voice recognition software and may include unintentional dictation errors.    Joni ReiningSmith, Jorel Gravlin K, PA-C 07/25/17 1541    Jene EveryKinner, Robert, MD 07/29/17 2200

## 2017-07-25 NOTE — ED Notes (Signed)

## 2017-09-16 ENCOUNTER — Other Ambulatory Visit: Payer: Self-pay

## 2017-09-16 ENCOUNTER — Emergency Department
Admission: EM | Admit: 2017-09-16 | Discharge: 2017-09-16 | Disposition: A | Discharge status: Home / Self Care | Payer: Self-pay | Attending: Emergency Medicine | Admitting: Emergency Medicine

## 2017-09-16 DIAGNOSIS — J020 Streptococcal pharyngitis: Secondary | ICD-10-CM | POA: Insufficient documentation

## 2017-09-16 DIAGNOSIS — Z87891 Personal history of nicotine dependence: Secondary | ICD-10-CM | POA: Insufficient documentation

## 2017-09-16 DIAGNOSIS — Z79899 Other long term (current) drug therapy: Secondary | ICD-10-CM | POA: Insufficient documentation

## 2017-09-16 LAB — GROUP A STREP BY PCR: Group A Strep by PCR: DETECTED — AB

## 2017-09-16 MED ORDER — LIDOCAINE VISCOUS HCL 2 % MT SOLN
15.0000 mL | Freq: Once | OROMUCOSAL | Status: AC
  Administered 2017-09-16: 15 mL via OROMUCOSAL
  Filled 2017-09-16: qty 15

## 2017-09-16 MED ORDER — PENICILLIN V POTASSIUM 500 MG PO TABS: 500.0000 mg | ORAL_TABLET | Freq: Three times a day (TID) | ORAL | 0 refills | Status: DC

## 2017-09-16 MED ORDER — LIDOCAINE VISCOUS HCL 2 % MT SOLN: 10.0000 mL | OROMUCOSAL | 0 refills | Status: DC | PRN

## 2017-09-16 NOTE — ED Provider Notes (Signed)
[facility] Emergency Department Provider Note  ____________________________________________  Time seen: Approximately 10:48 AM  I have reviewed the triage vital signs and the nursing notes.   HISTORY  Chief Complaint Sore Throat    HPI Abigail Rios is a 27 y.o. female that presents emergency department for evaluation of sore throat for 1 day.  She feels like she may need to cough but has not because her throat is sore.  Patient had strep throat 1 month ago and finished her antibiotics.  She had her tonsils removed when she was 11.  No fever, chills, nasal congestion.  Past Medical History:  Diagnosis Date  . Von Willebrand disease (HCC)     There are no active problems to display for this patient.   Past Surgical History:  Procedure Laterality Date  . TONSILLECTOMY      Prior to Admission medications   Medication Sig Start Date End Date Taking? Authorizing Provider  amoxicillin (AMOXIL) 500 MG capsule Take 1 capsule (500 mg total) by mouth 3 (three) times daily. 07/25/17   Joni ReiningSmith, Ronald K, PA-C  cephALEXin (KEFLEX) 500 MG capsule Take 1 capsule (500 mg total) by mouth 3 (three) times daily. 06/29/16   Tommi RumpsSummers, Rhonda L, PA-C  diphenhydrAMINE (BENYLIN) 12.5 MG/5ML syrup Take 5 mLs (12.5 mg total) by mouth 4 (four) times daily as needed for allergies. Mix with 5 mL of viscous lidocaine for swish and swallow. 07/25/17   Joni ReiningSmith, Ronald K, PA-C  hydrOXYzine (ATARAX/VISTARIL) 25 MG tablet Take 1 tablet (25 mg total) by mouth 3 (three) times daily as needed. 11/24/16   Little, Traci M, PA-C  lidocaine (XYLOCAINE) 2 % solution Use as directed 10 mLs in the mouth or throat as needed for mouth pain. 09/16/17   Enid DerryWagner, Maddock Finigan, PA-C  penicillin v potassium (VEETID) 500 MG tablet Take 1 tablet (500 mg total) by mouth 3 (three) times daily. 09/16/17   Enid DerryWagner, Kyian Obst, PA-C    Allergies Peanuts [peanut oil] and Aspirin  No family history on file.  Social  History Social History   Tobacco Use  . Smoking status: Former Games developermoker  . Smokeless tobacco: Never Used  Substance Use Topics  . Alcohol use: No  . Drug use: No     Review of Systems  Constitutional: No fever/chills Eyes: No visual changes. No discharge. ENT: Negative for congestion and rhinorrhea. Cardiovascular: No chest pain. Respiratory: Negative for cough. No SOB. Gastrointestinal: No abdominal pain.  No nausea, no vomiting.   Musculoskeletal: Negative for musculoskeletal pain. Skin: Negative for rash, abrasions, lacerations, ecchymosis. Neurological: Negative for headaches.   ____________________________________________   PHYSICAL EXAM:  VITAL SIGNS: ED Triage Vitals [09/16/17 1041]  Enc Vitals Group     BP 119/62     Pulse Rate 87     Resp 18     Temp 98.2 F (36.8 C)     Temp Source Oral     SpO2 100 %     Weight 217 lb (98.4 kg)     Height 5\' 5"  (1.651 m)     Head Circumference      Peak Flow      Pain Score 8     Pain Loc      Pain Edu?      Excl. in GC?      Constitutional: Alert and oriented. Well appearing and in no acute distress. Eyes: Conjunctivae are normal. PERRL. EOMI. No discharge. Head: Atraumatic. ENT: No frontal and maxillary sinus tenderness.  Ears: Tympanic membranes pearly gray with good landmarks. No discharge.      Nose: Mild congestion/rhinnorhea.      Mouth/Throat: Mucous membranes are moist. Oropharynx erythematous. Tonsils absent. No exudates. Uvula midline. Neck: No stridor.   Hematological/Lymphatic/Immunilogical: No cervical lymphadenopathy. Cardiovascular: Normal rate, regular rhythm.  Good peripheral circulation. Respiratory: Normal respiratory effort without tachypnea or retractions. Lungs CTAB. Good air entry to the bases with no decreased or absent breath sounds. Gastrointestinal: Bowel sounds 4 quadrants. Soft and nontender to palpation. No guarding or rigidity. No palpable masses. No  distention. Musculoskeletal: Full range of motion to all extremities. No gross deformities appreciated. Neurologic:  Normal speech and language. No gross focal neurologic deficits are appreciated.  Skin:  Skin is warm, dry and intact. No rash noted. Psychiatric: Mood and affect are normal. Speech and behavior are normal. Patient exhibits appropriate insight and judgement.   ____________________________________________   LABS (all labs ordered are listed, but only abnormal results are displayed)  Labs Reviewed  GROUP A STREP BY PCR - Abnormal; Notable for the following components:      Result Value   Group A Strep by PCR DETECTED (*)    All other components within normal limits   ____________________________________________  EKG   ____________________________________________  RADIOLOGY   No results found.  ____________________________________________    PROCEDURES  Procedure(s) performed:    Procedures    Medications  lidocaine (XYLOCAINE) 2 % viscous mouth solution 15 mL (15 mLs Mouth/Throat Given 09/16/17 1112)     ____________________________________________   INITIAL IMPRESSION / ASSESSMENT AND PLAN / ED COURSE  Pertinent labs & imaging results that were available during my care of the patient were reviewed by me and considered in my medical decision making (see chart for details).  Review of the [place] CSRS was performed in accordance of the [facility] prior to dispensing any controlled drugs.     Patient's diagnosis is consistent with strep pharyngitis. Vital signs and exam are reassuring. Patient appears well and is staying well hydrated. Patient feels comfortable going home. Patient will be discharged home with prescriptions for penicillin. Patient is to follow up with ENT as needed or otherwise directed. Patient is given ED precautions to return to the ED for any worsening or new symptoms.     ____________________________________________  FINAL CLINICAL  IMPRESSION(S) / ED DIAGNOSES  Final diagnoses:  Strep pharyngitis      NEW MEDICATIONS STARTED DURING THIS VISIT:  ED Discharge Orders        Ordered    penicillin v potassium (VEETID) 500 MG tablet  3 times daily     09/16/17 1221    lidocaine (XYLOCAINE) 2 % solution  As needed     09/16/17 1221          This chart was dictated using voice recognition software/Dragon. Despite best efforts to proofread, errors can occur which can change the meaning. Any change was purely unintentional.    Enid Derry, PA-C 09/16/17 1254    Sharman Cheek, MD 09/17/17 1555

## 2017-09-16 NOTE — ED Notes (Signed)
First Nurse Note:  Complaining of sore throat starting last PM.  Speech is clear, adult face mask given to patient to wear.

## 2017-09-16 NOTE — ED Notes (Signed)
See triage note  Presents with sore throat for couple of days  States she had strept about 2 months ago and sx's feel the same

## 2017-09-16 NOTE — ED Triage Notes (Signed)
Pt states that she has been having a sore throat since last night states that she was diagnosed with strep a month ago and hasn't had a problem since having her tonsils out. Pt reports that she completed all her antibiotic previously and changed her toothbrush

## 2017-10-04 ENCOUNTER — Emergency Department
Admission: EM | Admit: 2017-10-04 | Discharge: 2017-10-04 | Disposition: A | Discharge status: Home / Self Care | Payer: Self-pay | Attending: Emergency Medicine | Admitting: Emergency Medicine

## 2017-10-04 ENCOUNTER — Encounter: Payer: Self-pay | Admitting: Emergency Medicine

## 2017-10-04 DIAGNOSIS — R21 Rash and other nonspecific skin eruption: Secondary | ICD-10-CM | POA: Insufficient documentation

## 2017-10-04 DIAGNOSIS — Z87891 Personal history of nicotine dependence: Secondary | ICD-10-CM | POA: Insufficient documentation

## 2017-10-04 MED ORDER — PERMETHRIN 5 % EX CREA: TOPICAL_CREAM | CUTANEOUS | 0 refills | Status: DC

## 2017-10-04 MED ORDER — TRIAMCINOLONE ACETONIDE 0.5 % EX OINT: 1.0000 "application " | TOPICAL_OINTMENT | Freq: Two times a day (BID) | CUTANEOUS | 0 refills | Status: DC

## 2017-10-04 MED ORDER — TRIAMCINOLONE ACETONIDE 0.5 % EX OINT: 1.0000 "application " | TOPICAL_OINTMENT | Freq: Two times a day (BID) | CUTANEOUS | 0 refills | Status: AC

## 2017-10-04 NOTE — ED Triage Notes (Signed)
PT arrived with complaints of inner finger pain. PT was seen for scabies roughly 5 months prior and states the pain/rash had left but is now back and has been present for the last month.

## 2017-10-04 NOTE — ED Provider Notes (Signed)
[facility] Emergency Department Provider Note  ____________________________________________  Time seen: Approximately 1:29 PM  I have reviewed the triage vital signs and the nursing notes.   HISTORY  Chief Complaint Hand Pain    HPI Abigail Rios is a 27 y.o. female that presents the emergency department for evaluation of rash to bilateral hands for 6 months.  Rashes over right little finger and left middle and little finger.  Patient was treated for scabies 6 months ago.  Rash seemed to improve but has gotten worse.  Rash is not painful.  It itches.  No drainage.  No injury.  Hydrocortisone cream helps.  No fever, chills.   Past Medical History:  Diagnosis Date  . Von Willebrand disease (HCC)     There are no active problems to display for this patient.   Past Surgical History:  Procedure Laterality Date  . TONSILLECTOMY      Prior to Admission medications   Medication Sig Start Date End Date Taking? Authorizing Provider  amoxicillin (AMOXIL) 500 MG capsule Take 1 capsule (500 mg total) by mouth 3 (three) times daily. 07/25/17   Joni ReiningSmith, Ronald K, PA-C  cephALEXin (KEFLEX) 500 MG capsule Take 1 capsule (500 mg total) by mouth 3 (three) times daily. 06/29/16   Tommi RumpsSummers, Rhonda L, PA-C  diphenhydrAMINE (BENYLIN) 12.5 MG/5ML syrup Take 5 mLs (12.5 mg total) by mouth 4 (four) times daily as needed for allergies. Mix with 5 mL of viscous lidocaine for swish and swallow. 07/25/17   Joni ReiningSmith, Ronald K, PA-C  hydrOXYzine (ATARAX/VISTARIL) 25 MG tablet Take 1 tablet (25 mg total) by mouth 3 (three) times daily as needed. 11/24/16   Little, Traci M, PA-C  lidocaine (XYLOCAINE) 2 % solution Use as directed 10 mLs in the mouth or throat as needed for mouth pain. 09/16/17   Enid DerryWagner, Annleigh Knueppel, PA-C  penicillin v potassium (VEETID) 500 MG tablet Take 1 tablet (500 mg total) by mouth 3 (three) times daily. 09/16/17   Enid DerryWagner, Elwin Tsou, PA-C  permethrin (ELIMITE) 5 % cream Message cream  thoroughly into skin. Wash after 8-14 hours. Repeat in 2 weeks. 10/04/17   Enid DerryWagner, Kaloni Bisaillon, PA-C  triamcinolone ointment (KENALOG) 0.5 % Apply 1 application topically 2 (two) times daily. 10/04/17   Enid DerryWagner, Deyanna Mctier, PA-C    Allergies Peanuts [peanut oil] and Aspirin  No family history on file.  Social History Social History   Tobacco Use  . Smoking status: Former Games developermoker  . Smokeless tobacco: Never Used  Substance Use Topics  . Alcohol use: No  . Drug use: No     Review of Systems  Constitutional: No fever/chills Musculoskeletal: Negative for musculoskeletal pain. Skin: Negative for abrasions, lacerations, ecchymosis. Positive for rash. Neurological: Negative for numbness or tingling   ____________________________________________   PHYSICAL EXAM:  VITAL SIGNS: ED Triage Vitals [10/04/17 1153]  Enc Vitals Group     BP (!) 116/58     Pulse Rate 94     Resp 18     Temp 98.2 F (36.8 C)     Temp Source Oral     SpO2 99 %     Weight 217 lb (98.4 kg)     Height 5\' 5"  (1.651 m)     Head Circumference      Peak Flow      Pain Score 8     Pain Loc      Pain Edu?      Excl. in GC?      Constitutional:  Alert and oriented. Well appearing and in no acute distress. Eyes: Conjunctivae are normal. PERRL. EOMI. Head: Atraumatic. ENT:      Ears:      Nose: No congestion/rhinnorhea.      Mouth/Throat: Mucous membranes are moist.  Neck: No stridor.  Cardiovascular: Normal rate, regular rhythm.  Good peripheral circulation. Respiratory: Normal respiratory effort without tachypnea or retractions. Lungs CTAB. Good air entry to the bases with no decreased or absent breath sounds. Musculoskeletal: Full range of motion to all extremities. No gross deformities appreciated. Neurologic:  Normal speech and language. No gross focal neurologic deficits are appreciated.  Skin:  Skin is warm, dry and intact. Rough scaling skin to right little finger and left middle and little finger. Black  pinpoint hyperpigmented spots. No drainage. No vesicles Psychiatric: Mood and affect are normal. Speech and behavior are normal. Patient exhibits appropriate insight and judgement.   ____________________________________________   LABS (all labs ordered are listed, but only abnormal results are displayed)  Labs Reviewed - No data to display ____________________________________________  EKG   ____________________________________________  RADIOLOGY   No results found.  ____________________________________________    PROCEDURES  Procedure(s) performed:    Procedures    Medications - No data to display   ____________________________________________   INITIAL IMPRESSION / ASSESSMENT AND PLAN / ED COURSE  Pertinent labs & imaging results that were available during my care of the patient were reviewed by me and considered in my medical decision making (see chart for details).  Review of the [place] CSRS was performed in accordance of the [facility] prior to dispensing any controlled drugs.     Patient presented to the emergency department for evaluation of rash for 6 months.  Vital signs and exam are reassuring.  Patient states that rash improved with scabies treatment but then has returned.  Patient will be discharged home with prescriptions for permethrin and triamcinolone. Patient is to follow up with dermatology as directed.  She is agreeable to follow-up with dermatology for skin biopsy if rash does not improve with medication.  Patient is given ED precautions to return to the ED for any worsening or new symptoms.     ____________________________________________  FINAL CLINICAL IMPRESSION(S) / ED DIAGNOSES  Final diagnoses:  Rash      NEW MEDICATIONS STARTED DURING THIS VISIT:  ED Discharge Orders        Ordered    permethrin (ELIMITE) 5 % cream  Status:  Discontinued     10/04/17 1333    triamcinolone ointment (KENALOG) 0.5 %  2 times daily,   Status:   Discontinued     10/04/17 1333    permethrin (ELIMITE) 5 % cream     10/04/17 1342    triamcinolone ointment (KENALOG) 0.5 %  2 times daily     10/04/17 1342          This chart was dictated using voice recognition software/Dragon. Despite best efforts to proofread, errors can occur which can change the meaning. Any change was purely unintentional.    Enid Derry, PA-C 10/04/17 1525    Emily Filbert, MD 10/05/17 [phone]

## 2017-10-04 NOTE — ED Notes (Signed)
See triage note  Presents with rash to hands for several months  States areas are noted on fingers  No blister noted

## 2018-03-15 ENCOUNTER — Encounter: Payer: Self-pay | Admitting: Emergency Medicine

## 2018-03-15 ENCOUNTER — Other Ambulatory Visit: Payer: Self-pay

## 2018-03-15 ENCOUNTER — Emergency Department
Admission: EM | Admit: 2018-03-15 | Discharge: 2018-03-15 | Disposition: A | Discharge status: Home / Self Care | Payer: Self-pay | Attending: Emergency Medicine | Admitting: Emergency Medicine

## 2018-03-15 DIAGNOSIS — H10023 Other mucopurulent conjunctivitis, bilateral: Secondary | ICD-10-CM | POA: Insufficient documentation

## 2018-03-15 DIAGNOSIS — Z87891 Personal history of nicotine dependence: Secondary | ICD-10-CM | POA: Insufficient documentation

## 2018-03-15 DIAGNOSIS — R0981 Nasal congestion: Secondary | ICD-10-CM | POA: Insufficient documentation

## 2018-03-15 DIAGNOSIS — Z79899 Other long term (current) drug therapy: Secondary | ICD-10-CM | POA: Insufficient documentation

## 2018-03-15 MED ORDER — TOBRAMYCIN 0.3 % OP SOLN: 1.0000 [drp] | OPHTHALMIC | 0 refills | Status: AC

## 2018-03-15 MED ORDER — FEXOFENADINE-PSEUDOEPHED ER 60-120 MG PO TB12: 1.0000 | ORAL_TABLET | Freq: Two times a day (BID) | ORAL | 0 refills | Status: AC

## 2018-03-15 NOTE — ED Triage Notes (Signed)
C/O left eye pain and drainage x 3 days.  States right eye also starting to bother her too.

## 2018-03-15 NOTE — ED Notes (Signed)
PT c/o LFT eye swelling, pain, watery, itchy xfew days. + congestion

## 2018-03-15 NOTE — ED Provider Notes (Signed)
[facility] Emergency Department Provider Note   ____________________________________________   First MD Initiated Contact with Patient 03/15/18 1000     (approximate)  I have reviewed the triage vital signs and the nursing notes.   HISTORY  Chief Complaint Eye Problem    HPI COURTLYNN HOLLOMAN is a 27 y.o. female patient presents with left eye pain and drainage for 3 days.  Patient stated eyes matted shut in the morning.  Patient has some was diagnosed and treated for bacterial conjunctivitis last week.  Patient also complained of nasal congestion and rhinorrhea.  Patient denies fever chills associated with this complaint.  Patient denies nausea, vomiting, diarrhea.  No palates measured for complaint.  Past Medical History:  Diagnosis Date  . Von Willebrand disease (HCC)     There are no active problems to display for this patient.   Past Surgical History:  Procedure Laterality Date  . TONSILLECTOMY      Prior to Admission medications   Medication Sig Start Date End Date Taking? Authorizing Provider  amoxicillin (AMOXIL) 500 MG capsule Take 1 capsule (500 mg total) by mouth 3 (three) times daily. 07/25/17   Joni Reining, PA-C  cephALEXin (KEFLEX) 500 MG capsule Take 1 capsule (500 mg total) by mouth 3 (three) times daily. 06/29/16   Tommi Rumps, PA-C  diphenhydrAMINE (BENYLIN) 12.5 MG/5ML syrup Take 5 mLs (12.5 mg total) by mouth 4 (four) times daily as needed for allergies. Mix with 5 mL of viscous lidocaine for swish and swallow. 07/25/17   Joni Reining, PA-C  fexofenadine-pseudoephedrine (ALLEGRA-D) 60-120 MG 12 hr tablet Take 1 tablet by mouth 2 (two) times daily. 03/15/18   Joni Reining, PA-C  hydrOXYzine (ATARAX/VISTARIL) 25 MG tablet Take 1 tablet (25 mg total) by mouth 3 (three) times daily as needed. 11/24/16   Little, Traci M, PA-C  lidocaine (XYLOCAINE) 2 % solution Use as directed 10 mLs in the mouth or throat as needed for mouth  pain. 09/16/17   Enid Derry, PA-C  penicillin v potassium (VEETID) 500 MG tablet Take 1 tablet (500 mg total) by mouth 3 (three) times daily. 09/16/17   Enid Derry, PA-C  permethrin (ELIMITE) 5 % cream Message cream thoroughly into skin. Wash after 8-14 hours. Repeat in 2 weeks. 10/04/17   Enid Derry, PA-C  tobramycin (TOBREX) 0.3 % ophthalmic solution Place 1 drop into the right eye every 4 (four) hours for 10 days. 03/15/18 03/25/18  Joni Reining, PA-C  triamcinolone ointment (KENALOG) 0.5 % Apply 1 application topically 2 (two) times daily. 10/04/17   Enid Derry, PA-C    Allergies Peanuts [peanut oil] and Aspirin  No family history on file.  Social History Social History   Tobacco Use  . Smoking status: Former Games developer  . Smokeless tobacco: Never Used  Substance Use Topics  . Alcohol use: No  . Drug use: No    Review of Systems  Constitutional: No fever/chills Eyes: No visual changes.  Drainage bilateral eyes  ENT: Nasal congestion and runny nose no sore throat. Cardiovascular: Denies chest pain. Respiratory: Denies shortness of breath. Gastrointestinal: No abdominal pain.  No nausea, no vomiting.  No diarrhea.  No constipation. Genitourinary: Negative for dysuria. Musculoskeletal: Negative for back pain. Skin: Negative for rash. Neurological: Negative for headaches, focal weakness or numbness. Hematological/Lymphatic:Von Willebrand disease Allergic/Immunilogical: Peanuts and aspirin ____________________________________________   PHYSICAL EXAM:  VITAL SIGNS: ED Triage Vitals  Enc Vitals Group     BP 03/15/18  [id number] 115/64     Pulse Rate 03/15/18 0909 72     Resp 03/15/18 0909 18     Temp 03/15/18 0909 98.1 F (36.7 C)     Temp Source 03/15/18 0909 Oral     SpO2 03/15/18 0909 100 %     Weight 03/15/18 0852 216 lb 14.9 oz (98.4 kg)     Height --      Head Circumference --      Peak Flow --      Pain Score 03/15/18 0852 6     Pain Loc --      Pain  Edu? --      Excl. in GC? --    Constitutional: Alert and oriented. Well appearing and in no acute distress. Eyes: Conjunctivae are erythematous l. PERRL. EOMI. matted eyelids and purulent drainage left eye. Head: Atraumatic. Nose: Edematous nasal turbinates clear rhinorrhea. Mouth/Throat: Mucous membranes are moist.  Oropharynx non-erythematous.  Postnasal drainage. Neck: No stridor.  Hematological/Lymphatic/Immunilogical: No cervical lymphadenopathy. Cardiovascular: Normal rate, regular rhythm. Grossly normal heart sounds.  Good peripheral circulation. Respiratory: Normal respiratory effort.  No retractions. Lungs CTAB. Musculoskeletal: No lower extremity tenderness nor edema.  No joint effusions. Neurologic:  Normal speech and language. No gross focal neurologic deficits are appreciated. No gait instability. Skin:  Skin is warm, dry and intact. No rash noted. Psychiatric: Mood and affect are normal. Speech and behavior are normal.  ____________________________________________   LABS (all labs ordered are listed, but only abnormal results are displayed)  Labs Reviewed - No data to display ____________________________________________  EKG   ____________________________________________  RADIOLOGY  ED MD interpretation:    Official radiology report(s): No results found.  ____________________________________________   PROCEDURES  Procedure(s) performed: None  Procedures  Critical Care performed: No  ____________________________________________   INITIAL IMPRESSION / ASSESSMENT AND PLAN / ED COURSE  As part of my medical decision making, I reviewed the following data within the electronic MEDICAL RECORD NUMBER    Patient presented with purulent drainage from the ears consistent bacterial conjunctivitis.  Patient also has nasal congestion.  Patient given discharge care instruction and a prescription for tobramycin eyedrops and Allegra-D.  Patient given a work note and  ad[facility].      ____________________________________________   FINAL CLINICAL IMPRESSION(S) / ED DIAGNOSES  Final diagnoses:  Other mucopurulent conjunctivitis of both eyes  Nasal sinus congestion     ED Discharge Orders         Ordered    fexofenadine-pseudoephedrine (ALLEGRA-D) 60-120 MG 12 hr tablet  2 times daily     03/15/18 1014    tobramycin (TOBREX) 0.3 % ophthalmic solution  Every 4 hours     03/15/18 1014           Note:  This document was prepared using Dragon voice recognition software and may include unintentional dictation errors.    Joni ReiningSmith, Ronald K, PA-C 03/15/18 1023    Charlynne PanderYao, David Hsienta, MD 03/15/18 [phone]

## 2018-10-19 ENCOUNTER — Other Ambulatory Visit: Payer: Self-pay

## 2018-10-19 DIAGNOSIS — Z20822 Contact with and (suspected) exposure to covid-19: Secondary | ICD-10-CM

## 2018-10-22 LAB — NOVEL CORONAVIRUS, NAA: SARS-CoV-2, NAA: NOT DETECTED

## 2018-10-27 ENCOUNTER — Telehealth: Payer: Self-pay | Admitting: General Practice

## 2018-10-27 NOTE — Telephone Encounter (Signed)
Pt given Covid-19 results. Spoke with pt directly. °

## 2019-04-10 ENCOUNTER — Ambulatory Visit: Payer: Self-pay | Attending: Internal Medicine

## 2019-04-12 ENCOUNTER — Telehealth: Payer: Self-pay | Admitting: Certified Nurse Midwife

## 2019-04-12 NOTE — Telephone Encounter (Signed)
Pt called in and is requesting a call back from the nurse the pt is having bad headaches. Pt is thinking its from her birth control.

## 2019-04-13 NOTE — Telephone Encounter (Signed)
Called pt informed the pt that since we didn't put the nextplanon. ** in that we advise her to call the provider that did and ask them why she is having headache. & that once she is seen and talk to our doctor that we can move forward.

## 2019-04-13 NOTE — Telephone Encounter (Signed)
Pt was wanting to know if she was ok bc she is past her date to have her nextprem removed.  Pt is having bad headaches.

## 2019-04-13 NOTE — Telephone Encounter (Signed)
Pt is a new gyn she was put on annie schu.

## 2019-04-16 NOTE — Telephone Encounter (Signed)
Yes ma'am! 

## 2019-05-29 ENCOUNTER — Telehealth: Payer: Self-pay | Admitting: Certified Nurse Midwife

## 2019-05-29 ENCOUNTER — Encounter: Payer: Self-pay | Admitting: Certified Nurse Midwife

## 2019-05-29 NOTE — Telephone Encounter (Signed)
Called patient to touch base on an appointment she missed with [place] 05/29/19 to see if she wanted to reschedule. Let a voicemail for patient to return my phone call. -TC

## 2019-11-27 ENCOUNTER — Encounter: Payer: Self-pay | Admitting: Emergency Medicine

## 2019-11-27 ENCOUNTER — Emergency Department
Admission: EM | Admit: 2019-11-27 | Discharge: 2019-11-27 | Disposition: A | Discharge status: Home / Self Care | Payer: HRSA Program | Attending: Emergency Medicine | Admitting: Emergency Medicine

## 2019-11-27 ENCOUNTER — Emergency Department: Payer: HRSA Program

## 2019-11-27 ENCOUNTER — Other Ambulatory Visit: Payer: Self-pay

## 2019-11-27 DIAGNOSIS — U071 COVID-19: Secondary | ICD-10-CM | POA: Diagnosis not present

## 2019-11-27 DIAGNOSIS — F17200 Nicotine dependence, unspecified, uncomplicated: Secondary | ICD-10-CM | POA: Insufficient documentation

## 2019-11-27 DIAGNOSIS — Z91010 Allergy to peanuts: Secondary | ICD-10-CM | POA: Diagnosis not present

## 2019-11-27 DIAGNOSIS — R05 Cough: Secondary | ICD-10-CM | POA: Diagnosis present

## 2019-11-27 DIAGNOSIS — Z79899 Other long term (current) drug therapy: Secondary | ICD-10-CM | POA: Insufficient documentation

## 2019-11-27 LAB — SARS CORONAVIRUS 2 BY RT PCR (HOSPITAL ORDER, PERFORMED IN ~~LOC~~ HOSPITAL LAB): SARS Coronavirus 2: POSITIVE — AB

## 2019-11-27 LAB — GROUP A STREP BY PCR: Group A Strep by PCR: NOT DETECTED

## 2019-11-27 MED ORDER — AZITHROMYCIN 500 MG PO TABS: 500.0000 mg | ORAL_TABLET | Freq: Every day | ORAL | 0 refills | Status: AC

## 2019-11-27 MED ORDER — ONDANSETRON 4 MG PO TBDP: 4.0000 mg | ORAL_TABLET | Freq: Three times a day (TID) | ORAL | 0 refills | Status: AC | PRN

## 2019-11-27 NOTE — ED Provider Notes (Signed)
[facility] Emergency Department Provider Note  ____________________________________________   First MD Initiated Contact with Patient 11/27/19 [phone]     (approximate)  I have reviewed the triage vital signs and the nursing notes.   HISTORY  Chief Complaint Cough    HPI Abigail Rios is a 29 y.o. female with history of von Willebrand's disease presents to the emergency department secondary to 2-day history of productive cough throat discomfort.  Patient does admit that her boyfriend and son tested positive for Covid last Thursday and she subsequently started feeling ill on Friday.  Patient states that she lost smell today patient denied any fever temperature of 99.1.  Oxygen saturation 100% on room air on arrival.  Patient did not receive the COVID-19 vaccine.        Past Medical History:  Diagnosis Date  . Von Willebrand disease (HCC)     There are no problems to display for this patient.   Past Surgical History:  Procedure Laterality Date  . TONSILLECTOMY      Prior to Admission medications   Medication Sig Start Date End Date Taking? Authorizing Provider  amoxicillin (AMOXIL) 500 MG capsule Take 1 capsule (500 mg total) by mouth 3 (three) times daily. 07/25/17   Joni Reining, PA-C  cephALEXin (KEFLEX) 500 MG capsule Take 1 capsule (500 mg total) by mouth 3 (three) times daily. 06/29/16   Tommi Rumps, PA-C  diphenhydrAMINE (BENYLIN) 12.5 MG/5ML syrup Take 5 mLs (12.5 mg total) by mouth 4 (four) times daily as needed for allergies. Mix with 5 mL of viscous lidocaine for swish and swallow. 07/25/17   Joni Reining, PA-C  fexofenadine-pseudoephedrine (ALLEGRA-D) 60-120 MG 12 hr tablet Take 1 tablet by mouth 2 (two) times daily. 03/15/18   Joni Reining, PA-C  hydrOXYzine (ATARAX/VISTARIL) 25 MG tablet Take 1 tablet (25 mg total) by mouth 3 (three) times daily as needed. 11/24/16   Little, Traci M, PA-C  lidocaine (XYLOCAINE) 2 % solution  Use as directed 10 mLs in the mouth or throat as needed for mouth pain. 09/16/17   Enid Derry, PA-C  penicillin v potassium (VEETID) 500 MG tablet Take 1 tablet (500 mg total) by mouth 3 (three) times daily. 09/16/17   Enid Derry, PA-C  permethrin (ELIMITE) 5 % cream Message cream thoroughly into skin. Wash after 8-14 hours. Repeat in 2 weeks. 10/04/17   Enid Derry, PA-C  triamcinolone ointment (KENALOG) 0.5 % Apply 1 application topically 2 (two) times daily. 10/04/17   Enid Derry, PA-C    Allergies Peanuts [peanut oil] and Aspirin  No family history on file.  Social History Social History   Tobacco Use  . Smoking status: Current Some Day Smoker  . Smokeless tobacco: Never Used  Vaping Use  . Vaping Use: Some days  Substance Use Topics  . Alcohol use: No  . Drug use: No    Review of Systems Constitutional: No fever/chills Eyes: No visual changes. ENT: No sore throat. Cardiovascular: Denies chest pain. Respiratory: Denies shortness of breath. Gastrointestinal: No abdominal pain.  No nausea, no vomiting.  No diarrhea.  No constipation. Genitourinary: Negative for dysuria. Musculoskeletal: Negative for neck pain.  Negative for back pain. Integumentary: Negative for rash. Neurological: Negative for headaches, focal weakness or numbness.  ____________________________________________   PHYSICAL EXAM:  VITAL SIGNS: ED Triage Vitals  Enc Vitals Group     BP 11/27/19 0032 99/63     Pulse Rate 11/27/19 0032 90  Resp 11/27/19 0032 20     Temp 11/27/19 0032 99.1 F (37.3 C)     Temp Source 11/27/19 0032 Oral     SpO2 11/27/19 0032 100 %     Weight 11/27/19 0032 90.7 kg (200 lb)     Height --      Head Circumference --      Peak Flow --      Pain Score 11/27/19 0052 0     Pain Loc --      Pain Edu? --      Excl. in GC? --      Constitutional: Alert and oriented.  Eyes: Conjunctivae are normal.  Head: Atraumatic. Mouth/Throat: Patient is wearing a  mask. Neck: No stridor.  No meningeal signs.   Cardiovascular: Normal rate, regular rhythm. Good peripheral circulation. Grossly normal heart sounds. Respiratory: Normal respiratory effort.  No retractions. Gastrointestinal: Soft and nontender. No distention.  Musculoskeletal: No lower extremity tenderness nor edema. No gross deformities of extremities. Neurologic:  Normal speech and language. No gross focal neurologic deficits are appreciated.  Skin:  Skin is warm, dry and intact. Psychiatric: Mood and affect are normal. Speech and behavior are normal.  ____________________________________________   LABS (all labs ordered are listed, but only abnormal results are displayed)  Labs Reviewed  SARS CORONAVIRUS 2 BY RT PCR (HOSPITAL ORDER, PERFORMED IN [facility]) - Abnormal; Notable for the following components:      Result Value   SARS Coronavirus 2 POSITIVE (*)    All other components within normal limits  GROUP A STREP BY PCR    RADIOLOGY I, [place] Ketura Sirek, personally viewed and evaluated these images (plain radiographs) as part of my medical decision making, as well as reviewing the written report by the radiologist.  ED MD interpretation: No active disease noted on chest x-ray per radiologist.  Official radiology report(s): DG Chest 2 View  Result Date: 11/27/2019 CLINICAL DATA:  Cough EXAM: CHEST - 2 VIEW COMPARISON:  None. FINDINGS: The heart size and mediastinal contours are within normal limits. Both lungs are clear. The visualized skeletal structures are unremarkable. IMPRESSION: No active cardiopulmonary disease. Electronically Signed   By: Helyn Numbers MD   On: 11/27/2019 01:36    _______________________________________  Procedures   ____________________________________________   INITIAL IMPRESSION / MDM / ASSESSMENT AND PLAN / ED COURSE  As part of my medical decision making, I reviewed the following data within the electronic MEDICAL RECORD NUMBER   29 year old female presented with above-stated history and physical exam concerning for COVID-19 infection which was confirmed in the emergency department.  Patient be prescribed Zofran and azithromycin for home.  Spoke with the patient at length regarding management of COVID-19 at home including obtaining a pulse oximeter.  Patient advised of warning signs I would warrant immediate return to the emergency department.  ____________________________________________  FINAL CLINICAL IMPRESSION(S) / ED DIAGNOSES  Final diagnoses:  COVID-19 virus infection     MEDICATIONS GIVEN DURING THIS VISIT:  Medications - No data to display   ED Discharge Orders    None      *Please note:  Abigail Rios was evaluated in Emergency Department on 11/27/2019 for the symptoms described in the history of present illness. She was evaluated in the context of the global COVID-19 pandemic, which necessitated consideration that the patient might be at risk for infection with the SARS-CoV-2 virus that causes COVID-19. Institutional protocols and algorithms that pertain to the evaluation of patients at  risk for COVID-19 are in a state of rapid change based on information released by regulatory bodies including the CDC and federal and state organizations. These policies and algorithms were followed during the patient's care in the ED.  Some ED evaluations and interventions may be delayed as a result of limited staffing during and after the pandemic.*  Note:  This document was prepared using Dragon voice recognition software and may include unintentional dictation errors.   Darci Current, MD 11/27/19 [phone]

## 2019-11-27 NOTE — ED Triage Notes (Addendum)
Pt to triage via w/c with no distress noted; pt reports prod cough x 2 days; also st "when I swallow it feels like a lump in my throat"; also st loss of smell; st household members tested + for COVID

## 2020-04-17 ENCOUNTER — Emergency Department
Admission: EM | Admit: 2020-04-17 | Discharge: 2020-04-17 | Disposition: A | Discharge status: Home / Self Care | Payer: No Typology Code available for payment source | Attending: Emergency Medicine | Admitting: Emergency Medicine

## 2020-04-17 ENCOUNTER — Encounter: Payer: Self-pay | Admitting: *Deleted

## 2020-04-17 ENCOUNTER — Emergency Department: Payer: No Typology Code available for payment source

## 2020-04-17 ENCOUNTER — Other Ambulatory Visit: Payer: Self-pay

## 2020-04-17 DIAGNOSIS — Y92410 Unspecified street and highway as the place of occurrence of the external cause: Secondary | ICD-10-CM | POA: Insufficient documentation

## 2020-04-17 DIAGNOSIS — R519 Headache, unspecified: Secondary | ICD-10-CM | POA: Diagnosis present

## 2020-04-17 DIAGNOSIS — Z87891 Personal history of nicotine dependence: Secondary | ICD-10-CM | POA: Insufficient documentation

## 2020-04-17 DIAGNOSIS — Z91010 Allergy to peanuts: Secondary | ICD-10-CM | POA: Insufficient documentation

## 2020-04-17 DIAGNOSIS — M25562 Pain in left knee: Secondary | ICD-10-CM

## 2020-04-17 LAB — CBC WITH DIFFERENTIAL/PLATELET
Abs Immature Granulocytes: 0.01 10*3/uL (ref 0.00–0.07)
Basophils Absolute: 0 10*3/uL (ref 0.0–0.1)
Basophils Relative: 0 %
Eosinophils Absolute: 0.1 10*3/uL (ref 0.0–0.5)
Eosinophils Relative: 1 %
HCT: 35.9 % — ABNORMAL LOW (ref 36.0–46.0)
Hemoglobin: 12 g/dL (ref 12.0–15.0)
Immature Granulocytes: 0 %
Lymphocytes Relative: 43 %
Lymphs Abs: 4 10*3/uL (ref 0.7–4.0)
MCH: 28 pg (ref 26.0–34.0)
MCHC: 33.4 g/dL (ref 30.0–36.0)
MCV: 83.7 fL (ref 80.0–100.0)
Monocytes Absolute: 0.4 10*3/uL (ref 0.1–1.0)
Monocytes Relative: 5 %
Neutro Abs: 4.7 10*3/uL (ref 1.7–7.7)
Neutrophils Relative %: 51 %
Platelets: 322 10*3/uL (ref 150–400)
RBC: 4.29 MIL/uL (ref 3.87–5.11)
RDW: 13.6 % (ref 11.5–15.5)
WBC: 9.3 10*3/uL (ref 4.0–10.5)
nRBC: 0 % (ref 0.0–0.2)

## 2020-04-17 LAB — COMPREHENSIVE METABOLIC PANEL
ALT: 12 U/L (ref 0–44)
AST: 13 U/L — ABNORMAL LOW (ref 15–41)
Albumin: 4.2 g/dL (ref 3.5–5.0)
Alkaline Phosphatase: 47 U/L (ref 38–126)
Anion gap: 12 (ref 5–15)
BUN: 12 mg/dL (ref 6–20)
CO2: 25 mmol/L (ref 22–32)
Calcium: 9.4 mg/dL (ref 8.9–10.3)
Chloride: 106 mmol/L (ref 98–111)
Creatinine, Ser: 0.85 mg/dL (ref 0.44–1.00)
GFR, Estimated: >60 mL/min (ref >60)
Glucose, Bld: 92 mg/dL (ref 70–99)
Potassium: 3.7 mmol/L (ref 3.5–5.1)
Sodium: 143 mmol/L (ref 135–145)
Total Bilirubin: 0.8 mg/dL (ref 0.3–1.2)
Total Protein: 8.2 g/dL — ABNORMAL HIGH (ref 6.5–8.1)

## 2020-04-17 LAB — POC URINE PREG, ED: Preg Test, Ur: NEGATIVE

## 2020-04-17 LAB — PROTIME-INR
INR: 1 (ref 0.8–1.2)
Prothrombin Time: 13 seconds (ref 11.4–15.2)

## 2020-04-17 MED ORDER — ACETAMINOPHEN 500 MG PO TABS
1000.0000 mg | ORAL_TABLET | Freq: Once | ORAL | Status: AC
  Administered 2020-04-17: 1000 mg via ORAL
  Filled 2020-04-17: qty 2

## 2020-04-17 MED ORDER — HYDROMORPHONE HCL 1 MG/ML IJ SOLN
0.5000 mg | Freq: Once | INTRAMUSCULAR | Status: AC
  Administered 2020-04-17: 0.5 mg via INTRAVENOUS
  Filled 2020-04-17: qty 1
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
## 2020-04-17 NOTE — ED Notes (Signed)
Pt ot xray.   Pt alert.  Family with pt.

## 2020-04-17 NOTE — ED Notes (Signed)
poct pregnancy Negative 
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
## 2021-03-07 ENCOUNTER — Emergency Department
Admission: EM | Admit: 2021-03-07 | Discharge: 2021-03-07 | Disposition: A | Discharge status: Home / Self Care | Payer: No Typology Code available for payment source | Attending: Emergency Medicine | Admitting: Emergency Medicine

## 2021-03-07 ENCOUNTER — Emergency Department: Payer: No Typology Code available for payment source

## 2021-03-07 ENCOUNTER — Other Ambulatory Visit: Payer: Self-pay

## 2021-03-07 DIAGNOSIS — M25512 Pain in left shoulder: Secondary | ICD-10-CM

## 2021-03-07 DIAGNOSIS — Z87891 Personal history of nicotine dependence: Secondary | ICD-10-CM | POA: Diagnosis not present

## 2021-03-07 DIAGNOSIS — S161XXA Strain of muscle, fascia and tendon at neck level, initial encounter: Secondary | ICD-10-CM

## 2021-03-07 DIAGNOSIS — D6800 Von Willebrand disease, unspecified: Secondary | ICD-10-CM | POA: Diagnosis not present

## 2021-03-07 DIAGNOSIS — Z79899 Other long term (current) drug therapy: Secondary | ICD-10-CM | POA: Diagnosis not present

## 2021-03-07 DIAGNOSIS — R519 Headache, unspecified: Secondary | ICD-10-CM | POA: Diagnosis not present

## 2021-03-07 DIAGNOSIS — S199XXA Unspecified injury of neck, initial encounter: Secondary | ICD-10-CM | POA: Diagnosis not present

## 2021-03-07 MED ORDER — HYDROCODONE-ACETAMINOPHEN 5-325 MG PO TABS
1.0000 | ORAL_TABLET | Freq: Once | ORAL | Status: AC
  Administered 2021-03-07: 1 via ORAL
  Filled 2021-03-07: qty 1

## 2021-03-07 MED ORDER — CYCLOBENZAPRINE HCL 10 MG PO TABS: 10.0000 mg | ORAL_TABLET | Freq: Three times a day (TID) | ORAL | 0 refills | Status: DC | PRN

## 2021-03-07 MED ORDER — HYDROCODONE-ACETAMINOPHEN 5-325 MG PO TABS: 1.0000 | ORAL_TABLET | Freq: Four times a day (QID) | ORAL | 0 refills | Status: AC | PRN
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
## 2021-10-15 ENCOUNTER — Emergency Department
Admission: EM | Admit: 2021-10-15 | Discharge: 2021-10-15 | Disposition: A | Discharge status: Home / Self Care | Payer: Commercial Managed Care - HMO | Attending: Emergency Medicine | Admitting: Emergency Medicine

## 2021-10-15 ENCOUNTER — Encounter: Payer: Self-pay | Admitting: Intensive Care

## 2021-10-15 ENCOUNTER — Other Ambulatory Visit: Payer: Self-pay

## 2021-10-15 DIAGNOSIS — M546 Pain in thoracic spine: Secondary | ICD-10-CM | POA: Diagnosis not present

## 2021-10-15 DIAGNOSIS — M542 Cervicalgia: Secondary | ICD-10-CM | POA: Diagnosis present

## 2021-10-15 MED ORDER — ACETAMINOPHEN 500 MG PO TABS
1000.0000 mg | ORAL_TABLET | Freq: Once | ORAL | Status: AC
  Administered 2021-10-15: 1000 mg via ORAL
  Filled 2021-10-15: qty 2

## 2021-10-15 MED ORDER — IBUPROFEN 400 MG PO TABS
400.0000 mg | ORAL_TABLET | Freq: Once | ORAL | Status: AC
  Administered 2021-10-15: 400 mg via ORAL
  Filled 2021-10-15: qty 1

## 2021-10-15 MED ORDER — NAPROXEN 250 MG PO TABS: 250.0000 mg | ORAL_TABLET | Freq: Two times a day (BID) | ORAL | 0 refills | Status: AC

## 2021-10-15 MED ORDER — CYCLOBENZAPRINE HCL 10 MG PO TABS: 10.0000 mg | ORAL_TABLET | Freq: Three times a day (TID) | ORAL | 0 refills | Status: AC | PRN

## 2021-10-15 MED ORDER — DIAZEPAM 5 MG PO TABS
5.0000 mg | ORAL_TABLET | Freq: Once | ORAL | Status: AC
  Administered 2021-10-15: 5 mg via ORAL
  Filled 2021-10-15: qty 1

## 2021-10-15 NOTE — ED Provider Notes (Signed)
[facility] Provider Note    Event Date/Time   First MD Initiated Contact with Patient 10/15/21 1214     (approximate)   History   Neck Pain, Back Pain, and Arm Pain   HPI  Abigail Rios is a 31 y.o. female past medical history of von Willebrand disease presents with neck pain.  Patient woke up with pain in her neck.  It is worse on the right side and is more lateral feels improved when she looks towards the left but does not feel like her neck is stuck in that position.  Denies any injury.  She also endorses some mid thoracic right-sided back pain as well.  She did not take any medication over-the-counter for just came right to the emergency department.  Denies any numbness tingling weakness in her arms no bowel bladder incontinence no fevers chills.  Did have a sore throat yesterday which is somewhat improving.  Has had neck pain in the past but not as severe as this.  Denies headache    Past Medical History:  Diagnosis Date   Von Willebrand disease (HCC)     There are no problems to display for this patient.    Physical Exam  Triage Vital Signs: ED Triage Vitals  Enc Vitals Group     BP 10/15/21 1145 129/82     Pulse Rate 10/15/21 1145 70     Resp 10/15/21 1145 16     Temp 10/15/21 1143 98.1 F (36.7 C)     Temp Source 10/15/21 1143 Oral     SpO2 10/15/21 1145 100 %     Weight 10/15/21 1143 210 lb (95.3 kg)     Height 10/15/21 1143 5\' 6"  (1.676 m)     Head Circumference --      Peak Flow --      Pain Score 10/15/21 1143 10     Pain Loc --      Pain Edu? --      Excl. in [place]? --     Most recent vital signs: Vitals:   10/15/21 1143 10/15/21 1145  BP:  129/82  Pulse:  70  Resp:  16  Temp: 98.1 F (36.7 C)   SpO2:  100%     General: Awake, no distress.  CV:  Good peripheral perfusion.  Resp:  Normal effort.  Abd:  No distention.  Neuro:             Awake, Alert, Oriented x 3  Other:  Patient is holding her neck in 45 degrees of  flexion to the left, she is quite tender over the paraspinal region on the right and upper trap as well as over the right scapula she is able to rotate 45 degrees right and left Normal posterior oropharynx 5 out of 5 strength with elbow flexion extension grip wrist extension finger abduction Sensation grossly intact over the bilateral upper extremities   ED Results / Procedures / Treatments  Labs (all labs ordered are listed, but only abnormal results are displayed) Labs Reviewed - No data to display   EKG     RADIOLOGY    PROCEDURES:  Critical Care performed: No  Procedures  MEDICATIONS ORDERED IN ED: Medications  ibuprofen (ADVIL) tablet 400 mg (400 mg Oral Given 10/15/21 1401)  diazepam (VALIUM) tablet 5 mg (5 mg Oral Given 10/15/21 1401)  acetaminophen (TYLENOL) tablet 1,000 mg (1,000 mg Oral Given 10/15/21 1401)     IMPRESSION / MDM / ASSESSMENT  AND PLAN / ED COURSE  I reviewed the triage vital signs and the nursing notes.                              Patient's presentation is most consistent with acute, uncomplicated illness.  Differential diagnosis includes, but is not limited to, cervical strain, torticollis, less likely cervical epidural abscess, discitis/osteomyelitis, cervical artery dissection  The patient is a 31 year old female who presents with neck and upper back pain.  This started today when she woke up.  Pain is worse on the right side of the neck as well as the right upper back and she prefers to hold than neck in 45 degrees of flexion to the left.  He has no neurologic symptoms suggest cord involvement including no numbness tingling weakness in her extremities.  She has not had fevers although she did have a sore throat yesterday there is no abnormal findings in the posterior oropharynx and not concern for Lemierre's syndrome.  I suspect cervical strain/muscle spasm.  She was given p.o. Valium ibuprofen and Tylenol and had significant improvement in her  symptoms and range of motion was able to ambulate to the bathroom.  Will discharge with prescription for Flexeril and naproxen.  Discussed return precautions for new neurologic symptoms.       FINAL CLINICAL IMPRESSION(S) / ED DIAGNOSES   Final diagnoses:  Neck pain     Rx / DC Orders   ED Discharge Orders          Ordered    cyclobenzaprine (FLEXERIL) 10 MG tablet  3 times daily PRN        10/15/21 1515    naproxen (NAPROSYN) 250 MG tablet  2 times daily with meals        10/15/21 1515             Note:  This document was prepared using Dragon voice recognition software and may include unintentional dictation errors.   Georga Hacking, MD 10/15/21 Barry Brunner

## 2021-10-15 NOTE — Discharge Instructions (Addendum)
You likely strained the muscles in your neck.  You can take the Flexeril for neck spasm and naproxen for pain.  You can try ice or heat.  If you develop any numbness or weakness in your arms please return to the emergency department.

## 2021-10-15 NOTE — ED Notes (Signed)
See triage note  Presents with left sided neck pain and back pain . States she woke up like this   denies any recent injury
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
## 2022-01-05 ENCOUNTER — Emergency Department
Admission: EM | Admit: 2022-01-05 | Discharge: 2022-01-05 | Disposition: A | Discharge status: Home / Self Care | Payer: Commercial Managed Care - HMO | Attending: Emergency Medicine | Admitting: Emergency Medicine

## 2022-01-05 ENCOUNTER — Encounter: Payer: Self-pay | Admitting: Emergency Medicine

## 2022-01-05 DIAGNOSIS — K0889 Other specified disorders of teeth and supporting structures: Secondary | ICD-10-CM | POA: Diagnosis present

## 2022-01-05 DIAGNOSIS — K047 Periapical abscess without sinus: Secondary | ICD-10-CM | POA: Diagnosis not present

## 2022-01-05 MED ORDER — AMOXICILLIN 875 MG PO TABS: 875.0000 mg | ORAL_TABLET | Freq: Two times a day (BID) | ORAL | 0 refills | Status: AC

## 2022-01-05 MED ORDER — HYDROCODONE-ACETAMINOPHEN 5-325 MG PO TABS: 1.0000 | ORAL_TABLET | ORAL | 0 refills | Status: AC | PRN

## 2022-01-05 NOTE — ED Notes (Signed)
See triage note. Pt states doc referred her today but that this place won't accept her insurance. BC powder was in use by pt bc it was only thing that would help with pain; doc told her to stop taking it bc it could effect her during extraction per pt so stopped taking it; pt reports pain is now unbearable. Confirms no fever. Pt feels like "flap" has grown over tooth and that area is swollen. Pt denies any cavities at site stating it is simply how the tooth is coming in that is bothersome. Pt in NAD; skin dry, resp reg/unlabored, sitting calmly on stretcher; pt eating fast-food upon this RN's entrance into room.

## 2022-01-05 NOTE — ED Provider Notes (Signed)
[facility] Provider Note  Patient Contact: 9:04 PM (approximate)   History   Dental Pain   HPI  Abigail Rios is a 31 y.o. female who presents the emergency department complaining of left lower dental pain.  Patient has a ingrowing wisdom tooth has a small flap of skin covering the area.  It is swollen and painful.  Patient states no fevers or chills, difficulty breathing or swallowing.  She states that she has difficulty with bleeding and has been sent to an oral surgeon for extraction.     Physical Exam   Triage Vital Signs: ED Triage Vitals [01/05/22 1915]  Enc Vitals Group     BP 127/84     Pulse Rate 69     Resp 17     Temp 98.4 F (36.9 C)     Temp Source Oral     SpO2 100 %     Weight 205 lb (93 kg)     Height 5\' 6"  (1.676 m)     Head Circumference      Peak Flow      Pain Score      Pain Loc      Pain Edu?      Excl. in [place]?     Most recent vital signs: Vitals:   01/05/22 1915 01/05/22 2129  BP: 127/84 105/74  Pulse: 69 76  Resp: 17 18  Temp: 98.4 F (36.9 C) 98.3 F (36.8 C)  SpO2: 100% 100%     General: Alert and in no acute distress. ENT:      Ears:       Nose: No congestion/rhinnorhea.      Mouth/Throat: Mucous membranes are moist.  Patient has a flap of gingival tissue over incoming left wisdom tooth.  Surrounding erythema edema without drainable fluid collection.  No edema of the cheek or submandibular region. Hematological/Lymphatic/Immunilogical: No cervical lymphadenopathy. Cardiovascular:  Good peripheral perfusion Respiratory: Normal respiratory effort without tachypnea or retractions. Lungs CTAB. Musculoskeletal: Full range of motion to all extremities.  Neurologic:  No gross focal neurologic deficits are appreciated.  Skin:   No rash noted Other:   ED Results / Procedures / Treatments   Labs (all labs ordered are listed, but only abnormal results are displayed) Labs Reviewed - No data to  display   EKG     RADIOLOGY    No results found.  PROCEDURES:  Critical Care performed: No  Procedures   MEDICATIONS ORDERED IN ED: Medications - No data to display   IMPRESSION / MDM / [place] / ED COURSE  I reviewed the triage vital signs and the nursing notes.                              Differential diagnosis includes, but is not limited to, dental infection broken dentition, wisdom tooth, sialoadenitis, parotitis  Patient's presentation is most consistent with acute presentation with potential threat to life or bodily function.   Patient's diagnosis is consistent with dental infection.  Patient presented to the emergency department with pain to the left lower dentition.  Findings consistent with dental infection.  Patient will need to see a oral surgeon to extract her wisdom tooth.  Follow-up with oral surgery.  Prescriptions have been sent for the patient at this time..  Patient is given ED precautions to return to the ED for any worsening or new symptoms.  FINAL CLINICAL IMPRESSION(S) / ED DIAGNOSES   Final diagnoses:  Dental infection     Rx / DC Orders   ED Discharge Orders          Ordered    amoxicillin (AMOXIL) 875 MG tablet  2 times daily        01/05/22 2123    HYDROcodone-acetaminophen (NORCO/VICODIN) 5-325 MG tablet  Every 4 hours PRN        01/05/22 2123             Note:  This document was prepared using Dragon voice recognition software and may include unintentional dictation errors.   Darletta Moll, PA-C 01/07/22 2351    Naaman Plummer, MD 01/08/22 1105

## 2022-01-05 NOTE — ED Triage Notes (Signed)
Pt c/o left lower wisdom tooth pain x5 days, worsening in last 24 hours. Pt sts she has referral for extraction but is not able to bare pain. Pt denies fever.

## 2022-01-05 NOTE — ED Notes (Signed)
Pt A&O, pt given discharge instructions, pt ambulating with steady gait. 
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
## 2022-07-07 ENCOUNTER — Other Ambulatory Visit: Payer: Self-pay

## 2022-07-07 ENCOUNTER — Emergency Department
Admission: EM | Admit: 2022-07-07 | Discharge: 2022-07-07 | Disposition: A | Discharge status: Home / Self Care | Payer: 59 | Attending: Emergency Medicine | Admitting: Emergency Medicine

## 2022-07-07 ENCOUNTER — Emergency Department: Payer: 59

## 2022-07-07 DIAGNOSIS — J029 Acute pharyngitis, unspecified: Secondary | ICD-10-CM | POA: Diagnosis not present

## 2022-07-07 DIAGNOSIS — R051 Acute cough: Secondary | ICD-10-CM | POA: Diagnosis not present

## 2022-07-07 DIAGNOSIS — R059 Cough, unspecified: Secondary | ICD-10-CM | POA: Diagnosis not present

## 2022-07-07 DIAGNOSIS — J020 Streptococcal pharyngitis: Secondary | ICD-10-CM | POA: Insufficient documentation

## 2022-07-07 DIAGNOSIS — Z91010 Allergy to peanuts: Secondary | ICD-10-CM | POA: Insufficient documentation

## 2022-07-07 DIAGNOSIS — Z1152 Encounter for screening for COVID-19: Secondary | ICD-10-CM | POA: Diagnosis not present

## 2022-07-07 LAB — GROUP A STREP BY PCR: Group A Strep by PCR: DETECTED — AB

## 2022-07-07 LAB — SARS CORONAVIRUS 2 BY RT PCR: SARS Coronavirus 2 by RT PCR: NEGATIVE

## 2022-07-07 MED ORDER — PENICILLIN G BENZATHINE 1200000 UNIT/2ML IM SUSY
1.2000 10*6.[IU] | PREFILLED_SYRINGE | Freq: Once | INTRAMUSCULAR | Status: AC
  Administered 2022-07-07: 1.2 10*6.[IU] via INTRAMUSCULAR
  Filled 2022-07-07: qty 2

## 2022-07-07 MED ORDER — BENZONATATE 100 MG PO CAPS: 100.0000 mg | ORAL_CAPSULE | Freq: Three times a day (TID) | ORAL | 0 refills | Status: AC | PRN

## 2022-07-07 MED ORDER — PREDNISONE 10 MG PO TABS
10.0000 mg | ORAL_TABLET | Freq: Every day | ORAL | 0 refills | Status: AC
Start: 2022-07-07 — End: ?

## 2022-07-07 MED ORDER — IBUPROFEN 800 MG PO TABS
800.0000 mg | ORAL_TABLET | Freq: Once | ORAL | Status: AC
  Administered 2022-07-07: 800 mg via ORAL
  Filled 2022-07-07: qty 1

## 2022-07-07 MED ORDER — PREDNISONE 10 MG PO TABS: 10.0000 mg | ORAL_TABLET | Freq: Every day | ORAL | 0 refills | Status: DC

## 2022-07-07 MED ORDER — ALBUTEROL SULFATE HFA 108 (90 BASE) MCG/ACT IN AERS: 2.0000 | INHALATION_SPRAY | Freq: Four times a day (QID) | RESPIRATORY_TRACT | 2 refills | Status: AC | PRN

## 2022-07-07 MED ORDER — BENZONATATE 100 MG PO CAPS: 100.0000 mg | ORAL_CAPSULE | Freq: Three times a day (TID) | ORAL | 0 refills | Status: DC | PRN

## 2022-07-07 MED ORDER — ALBUTEROL SULFATE HFA 108 (90 BASE) MCG/ACT IN AERS: 2.0000 | INHALATION_SPRAY | Freq: Four times a day (QID) | RESPIRATORY_TRACT | 2 refills | Status: DC | PRN

## 2022-07-07 NOTE — Discharge Instructions (Addendum)
Please take medications as needed for cough, congestion.  Continue with Tylenol and ibuprofen.  Make sure you are drinking lots of fluids.  Return to the ER for any difficulty swallowing, fevers above 101 that are not going down with Tylenol or ibuprofen or any worsening symptoms or any changes in health

## 2022-07-07 NOTE — ED Triage Notes (Signed)
Pt reports flu-like symptoms x2 days. Pt reports cough and body aches. Pt reports has been coughing a lot and started coughing up some blood and chest has been hurting from coughing.

## 2022-07-07 NOTE — ED Notes (Signed)
ED Provider at bedside. 

## 2022-07-07 NOTE — ED Provider Notes (Signed)
[facility] Provider Note   CSN: [id number] Arrival date & time: 07/07/22  1506     History { Chief Complaint  Patient presents with   Generalized Body Aches    Abigail Rios is a 32 y.o. female.  Presents to the emergency department for evaluation of cough, body aches, congestion.  She has had a slight sore throat.  Patient's sister was diagnosed today with strep.  She denies any fevers.  She has not any medications for her pain.  She denies any abdominal pain, nausea vomiting or diarrhea  HPI     Home Medications Prior to Admission medications   Medication Sig Start Date End Date Taking? Authorizing Provider  albuterol (VENTOLIN HFA) 108 (90 Base) MCG/ACT inhaler Inhale 2 puffs into the lungs every 6 (six) hours as needed for wheezing or shortness of breath. 07/07/22  Yes Duanne Guess, PA-C  benzonatate (TESSALON PERLES) 100 MG capsule Take 1 capsule (100 mg total) by mouth 3 (three) times daily as needed for cough. 07/07/22 07/07/23 Yes Duanne Guess, PA-C  predniSONE (DELTASONE) 10 MG tablet Take 1 tablet (10 mg total) by mouth daily. 6,5,4,3,2,1 six day taper 07/07/22  Yes Duanne Guess, PA-C  amoxicillin (AMOXIL) 875 MG tablet Take 1 tablet (875 mg total) by mouth 2 (two) times daily. 01/05/22   Cuthriell, Charline Bills, PA-C  diphenhydrAMINE (BENYLIN) 12.5 MG/5ML syrup Take 5 mLs (12.5 mg total) by mouth 4 (four) times daily as needed for allergies. Mix with 5 mL of viscous lidocaine for swish and swallow. 07/25/17   Sable Feil, PA-C  fexofenadine-pseudoephedrine (ALLEGRA-D) 60-120 MG 12 hr tablet Take 1 tablet by mouth 2 (two) times daily. 03/15/18   Sable Feil, PA-C  HYDROcodone-acetaminophen (NORCO/VICODIN) 5-325 MG tablet Take 1 tablet by mouth every 4 (four) hours as needed for moderate pain. 01/05/22 01/05/23  Cuthriell, Charline Bills, PA-C  hydrOXYzine (ATARAX/VISTARIL) 25 MG tablet Take 1 tablet (25 mg total) by mouth 3 (three)  times daily as needed. 11/24/16   Little, Traci M, PA-C  ondansetron (ZOFRAN ODT) 4 MG disintegrating tablet Take 1 tablet (4 mg total) by mouth every 8 (eight) hours as needed. 11/27/19   Gregor Hams, MD  triamcinolone ointment (KENALOG) 0.5 % Apply 1 application topically 2 (two) times daily. 10/04/17   Laban Emperor, PA-C      Allergies    Peanuts [peanut oil] and Aspirin    Review of Systems   Review of Systems  Physical Exam Updated Vital Signs BP 123/80   Pulse 72   Temp 98.6 F (37 C)   Resp 16   Ht 5\' 6"  (1.676 m)   Wt 76.7 kg   SpO2 100%   BMI 27.28 kg/m  Physical Exam Constitutional:      Appearance: She is well-developed.  HENT:     Head: Normocephalic and atraumatic.     Mouth/Throat:     Mouth: Mucous membranes are moist.     Pharynx: Posterior oropharyngeal erythema present. No oropharyngeal exudate.  Eyes:     Conjunctiva/sclera: Conjunctivae normal.  Cardiovascular:     Rate and Rhythm: Normal rate.  Pulmonary:     Effort: Pulmonary effort is normal. No respiratory distress.     Breath sounds: No wheezing or rales.  Abdominal:     General: Abdomen is flat. There is no distension.     Tenderness: There is no abdominal tenderness. There is no guarding.  Musculoskeletal:  General: Normal range of motion.     Cervical back: Normal range of motion.  Skin:    General: Skin is warm.     Findings: No rash.  Neurological:     General: No focal deficit present.     Mental Status: She is alert and oriented to person, place, and time. Mental status is at baseline.  Psychiatric:        Behavior: Behavior normal.        Thought Content: Thought content normal.     ED Results / Procedures / Treatments   Labs (all labs ordered are listed, but only abnormal results are displayed) Labs Reviewed  GROUP A STREP BY PCR - Abnormal; Notable for the following components:      Result Value   Group A Strep by PCR DETECTED (*)    All other components  within normal limits  SARS CORONAVIRUS 2 BY RT PCR    EKG None  Radiology DG Chest 2 View  Result Date: 07/07/2022 CLINICAL DATA:  Cough EXAM: CHEST - 2 VIEW COMPARISON:  X-ray 04/17/2020 FINDINGS: The heart size and mediastinal contours are within normal limits. Both lungs are clear. No consolidation, pneumothorax, effusion or edema. The visualized skeletal structures are unremarkable. IMPRESSION: No acute cardiopulmonary disease. Electronically Signed   By: Jill Side M.D.   On: 07/07/2022 18:51    Procedures Procedures    Medications Ordered in ED Medications  penicillin g benzathine (BICILLIN LA) 1200000 UNIT/2ML injection 1.2 Million Units (has no administration in time range)  ibuprofen (ADVIL) tablet 800 mg (800 mg Oral Given 07/07/22 1902)    ED Course/ Medical Decision Making/ A&P                             Medical Decision Making Amount and/or Complexity of Data Reviewed Radiology: ordered.  Risk Prescription drug management.   32 year old female with 3 days of cough, body aches, congestion and mild sore throat.  Her strep test is positive.  Chest x-ray negative for any acute cardiopulmonary process.  Vital signs are stable, afebrile.  She is tolerating p.o. well.  Discussed treatment options for strep with her, she is agreeable to 1,200,000 units of penicillin G, this is given today.  She will continue with Tylenol, ibuprofen and increase fluids.  For her cough we prescribed her a prednisone taper along with Tessalon Perles and albuterol inhaler.  Patient did have a negative COVID test.  She understands signs symptoms return to the ER for.  She was given a note for work for today and tomorrow. Final Clinical Impression(s) / ED Diagnoses Final diagnoses:  Strep pharyngitis  Acute cough    Rx / DC Orders ED Discharge Orders          Ordered    albuterol (VENTOLIN HFA) 108 (90 Base) MCG/ACT inhaler  Every 6 hours PRN        07/07/22 1951    predniSONE  (DELTASONE) 10 MG tablet  Daily        07/07/22 1951    benzonatate (TESSALON PERLES) 100 MG capsule  3 times daily PRN        07/07/22 1951              Renata Caprice 07/07/22 Bobetta Lime, MD 07/07/22 2337
